# Patient Record
Sex: Female | Born: 1975
Health system: Southern US, Community
[De-identification: ages and names within clinical notes are randomized; demographics above are authoritative.]

## PROBLEM LIST (undated history)

## (undated) DIAGNOSIS — H6692 Otitis media, unspecified, left ear: Principal | ICD-10-CM

## (undated) DIAGNOSIS — F419 Anxiety disorder, unspecified: Secondary | ICD-10-CM

## (undated) DIAGNOSIS — D649 Anemia, unspecified: Secondary | ICD-10-CM

## (undated) DIAGNOSIS — Z0000 Encounter for general adult medical examination without abnormal findings: Secondary | ICD-10-CM

## (undated) DIAGNOSIS — R7989 Other specified abnormal findings of blood chemistry: Secondary | ICD-10-CM

## (undated) DIAGNOSIS — K219 Gastro-esophageal reflux disease without esophagitis: Secondary | ICD-10-CM

## (undated) DIAGNOSIS — Z87442 Personal history of urinary calculi: Secondary | ICD-10-CM

## (undated) DIAGNOSIS — C539 Malignant neoplasm of cervix uteri, unspecified: Secondary | ICD-10-CM

## (undated) DIAGNOSIS — Z124 Encounter for screening for malignant neoplasm of cervix: Secondary | ICD-10-CM

## (undated) DIAGNOSIS — K5900 Constipation, unspecified: Secondary | ICD-10-CM

## (undated) DIAGNOSIS — F329 Major depressive disorder, single episode, unspecified: Secondary | ICD-10-CM

## (undated) DIAGNOSIS — F32A Depression, unspecified: Secondary | ICD-10-CM

## (undated) DIAGNOSIS — E782 Mixed hyperlipidemia: Secondary | ICD-10-CM

## (undated) DIAGNOSIS — J329 Chronic sinusitis, unspecified: Secondary | ICD-10-CM

## (undated) DIAGNOSIS — T7840XA Allergy, unspecified, initial encounter: Secondary | ICD-10-CM

## (undated) DIAGNOSIS — Z87891 Personal history of nicotine dependence: Secondary | ICD-10-CM

## (undated) HISTORY — DX: Gastro-esophageal reflux disease without esophagitis: K21.9

## (undated) HISTORY — PX: LITHOTRIPSY: SUR834

## (undated) HISTORY — DX: Depression, unspecified: F32.A

## (undated) HISTORY — DX: Anxiety disorder, unspecified: F41.9

## (undated) HISTORY — DX: Personal history of urinary calculi: Z87.442

## (undated) HISTORY — DX: Otitis media, unspecified, left ear: H66.92

## (undated) HISTORY — DX: Major depressive disorder, single episode, unspecified: F32.9

## (undated) HISTORY — PX: CYSTOSCOPY W/ URETERAL STENT PLACEMENT: SHX1429

## (undated) HISTORY — DX: Mixed hyperlipidemia: E78.2

## (undated) HISTORY — DX: Anemia, unspecified: D64.9

## (undated) HISTORY — DX: Malignant neoplasm of cervix uteri, unspecified: C53.9

## (undated) HISTORY — DX: Encounter for screening for malignant neoplasm of cervix: Z12.4

## (undated) HISTORY — DX: Other specified abnormal findings of blood chemistry: R79.89

## (undated) HISTORY — DX: Encounter for general adult medical examination without abnormal findings: Z00.00

## (undated) HISTORY — DX: Allergy, unspecified, initial encounter: T78.40XA

## (undated) HISTORY — DX: Constipation, unspecified: K59.00

## (undated) HISTORY — DX: Personal history of nicotine dependence: Z87.891

## (undated) HISTORY — DX: Chronic sinusitis, unspecified: J32.9

---

## 1998-10-10 ENCOUNTER — Emergency Department (HOSPITAL_COMMUNITY): Admission: EM | Admit: 1998-10-10 | Discharge: 1998-10-10 | Payer: Self-pay | Admitting: Emergency Medicine

## 2003-03-05 ENCOUNTER — Other Ambulatory Visit: Admission: RE | Admit: 2003-03-05 | Discharge: 2003-03-05 | Payer: Self-pay | Admitting: Obstetrics and Gynecology

## 2003-03-11 ENCOUNTER — Ambulatory Visit (HOSPITAL_COMMUNITY): Admission: RE | Admit: 2003-03-11 | Discharge: 2003-03-11 | Payer: Self-pay | Admitting: Obstetrics and Gynecology

## 2003-03-11 ENCOUNTER — Encounter: Payer: Self-pay | Admitting: Obstetrics and Gynecology

## 2003-05-22 ENCOUNTER — Inpatient Hospital Stay (HOSPITAL_COMMUNITY): Admission: AD | Admit: 2003-05-22 | Discharge: 2003-05-26 | Payer: Self-pay | Admitting: Obstetrics and Gynecology

## 2003-07-28 ENCOUNTER — Inpatient Hospital Stay (HOSPITAL_COMMUNITY): Admission: AD | Admit: 2003-07-28 | Discharge: 2003-07-28 | Payer: Self-pay | Admitting: Obstetrics and Gynecology

## 2003-08-17 ENCOUNTER — Inpatient Hospital Stay (HOSPITAL_COMMUNITY): Admission: AD | Admit: 2003-08-17 | Discharge: 2003-08-17 | Payer: Self-pay | Admitting: Obstetrics and Gynecology

## 2003-09-14 ENCOUNTER — Inpatient Hospital Stay (HOSPITAL_COMMUNITY): Admission: AD | Admit: 2003-09-14 | Discharge: 2003-09-16 | Payer: Self-pay | Admitting: Obstetrics and Gynecology

## 2004-11-03 ENCOUNTER — Other Ambulatory Visit: Admission: RE | Admit: 2004-11-03 | Discharge: 2004-11-03 | Payer: Self-pay | Admitting: Obstetrics and Gynecology

## 2005-01-16 ENCOUNTER — Emergency Department (HOSPITAL_COMMUNITY): Admission: EM | Admit: 2005-01-16 | Discharge: 2005-01-16 | Payer: Self-pay | Admitting: Family Medicine

## 2005-06-17 ENCOUNTER — Emergency Department (HOSPITAL_COMMUNITY): Admission: EM | Admit: 2005-06-17 | Discharge: 2005-06-17 | Payer: Self-pay | Admitting: Family Medicine

## 2005-11-22 ENCOUNTER — Other Ambulatory Visit: Admission: RE | Admit: 2005-11-22 | Discharge: 2005-11-22 | Payer: Self-pay | Admitting: Obstetrics and Gynecology

## 2006-05-22 ENCOUNTER — Inpatient Hospital Stay (HOSPITAL_COMMUNITY): Admission: RE | Admit: 2006-05-22 | Discharge: 2006-05-24 | Payer: Self-pay | Admitting: Obstetrics and Gynecology

## 2006-06-02 ENCOUNTER — Inpatient Hospital Stay: Admission: AD | Admit: 2006-06-02 | Discharge: 2006-06-02 | Payer: Self-pay | Admitting: Obstetrics and Gynecology

## 2009-05-06 ENCOUNTER — Ambulatory Visit: Payer: Self-pay | Admitting: Family Medicine

## 2009-05-06 DIAGNOSIS — F329 Major depressive disorder, single episode, unspecified: Secondary | ICD-10-CM

## 2009-05-06 DIAGNOSIS — R1013 Epigastric pain: Secondary | ICD-10-CM

## 2009-05-06 DIAGNOSIS — K3189 Other diseases of stomach and duodenum: Secondary | ICD-10-CM

## 2009-06-09 ENCOUNTER — Ambulatory Visit: Payer: Self-pay | Admitting: Family Medicine

## 2009-06-30 ENCOUNTER — Telehealth: Payer: Self-pay | Admitting: Family Medicine

## 2009-08-02 ENCOUNTER — Telehealth: Payer: Self-pay | Admitting: Family Medicine

## 2009-08-03 ENCOUNTER — Telehealth: Payer: Self-pay | Admitting: Family Medicine

## 2009-08-05 ENCOUNTER — Ambulatory Visit: Payer: Self-pay | Admitting: Family Medicine

## 2009-08-06 ENCOUNTER — Encounter (INDEPENDENT_AMBULATORY_CARE_PROVIDER_SITE_OTHER): Payer: Self-pay | Admitting: *Deleted

## 2010-07-19 NOTE — Progress Notes (Signed)
Summary: Trouble sleeping  Phone Note Call from Patient   Caller: Patient Summary of Call: Pt LMOM stating you asked her to call if she was still having trouble sleeping and she is. Please advise.  Initial call taken by: Payton Spark CMA,  June 30, 2009 1:11 PM  Follow-up for Phone Call        Have her try taking the Alprazolam at bedtime for the next wk and let me know if this is helping. Follow-up by: Seymour Bars DO,  June 30, 2009 1:14 PM     Appended Document: Trouble sleeping Pt agreed to try and will CB next week.

## 2010-07-19 NOTE — Assessment & Plan Note (Signed)
Summary: depression   Vital Signs:  Patient profile:   35 year old female Menstrual status:  regular Height:      62 inches Weight:      104 pounds BMI:     19.09 O2 Sat:      99 % on Room air Temp:     99.2 degrees F oral Pulse rate:   60 / minute BP sitting:   103 / 61  (right arm) Cuff size:   regular  Vitals Entered By: Payton Spark CMA/April (August 05, 2009 4:02 PM)  O2 Flow:  Room air CC: f/u on meds    Primary Care Provider:  Seymour Bars DO  CC:  f/u on meds .  History of Present Illness: 35 yo WF presents for f/u depression.  She has been under a lot of stress with furniture market and missing work with her child being sick.  She talked to her husband and her boss about some of her stressors and that is making her feel better.  She stayed home and just slept.    She denies anxiety, panic attacks or suicidal thoughts.   She is not drinking ETOH.  She is yet to start exercising again.  Current Medications (verified): 1)  Nuvaring 0.12-0.015 Mg/24hr Ring (Etonogestrel-Ethinyl Estradiol) .... Use As Directed 2)  Zoloft 50 Mg Tabs (Sertraline Hcl) .Marland Kitchen.. 1 Tab By Mouth Daily 3)  Alprazolam 0.25 Mg Tabs (Alprazolam) .Marland Kitchen.. 1 Tab By Mouth Two Times A Day As Needed Anxiety 4)  Omeprazole 40 Mg Cpdr (Omeprazole) .Marland Kitchen.. 1 Tab By Mouth Daily; Take 30 Min Before Breakfast 5)  Tamiflu 75 Mg Caps (Oseltamivir Phosphate) .Marland Kitchen.. 1 Tab By Mouth Daily X 10 Days  Allergies (verified): No Known Drug Allergies  Past History:  Past Medical History: Reviewed history from 05/06/2009 and no changes required. none [id number]  Social History: Reviewed history from 05/06/2009 and no changes required. 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 Married to [place].  Has 2 daughters, 3 and 5. 2 cigs/ wk.  2-4 ETOH/ wk. No regular exercise. Fair diet.   Review of Systems Psych:  Complains of anxiety, depression, easily tearful, and irritability; denies panic attacks, sense of great danger, suicidal  thoughts/plans, thoughts of violence, unusual visions or sounds, and thoughts /plans of harming others.  Physical Exam  General:  alert, well-developed, well-nourished, and well-hydrated.   Skin:  color normal.   Psych:  good eye contact, not anxious appearing, and flat affect.     Impression & Recommendations:  Problem # 1:  DEPRESSION (ICD-311) Having more problems at work and home which have caused more symptoms of depression.  We discussed looking at some changes to her work schedule and having more time for herself.  Will try to go up to 100 mg of Zoloft once daily.  Will add counseling.  Call if any problems o/w f/u in 2-3 months.   Her updated medication list for this problem includes:    Zoloft 50 Mg Tabs (Sertraline hcl) .Marland Kitchen... 2 tabs by mouth once daily    Alprazolam 0.25 Mg Tabs (Alprazolam) .Marland Kitchen... 1 tab by mouth two times a day as needed anxiety  Orders: Psychology Referral (Psychology)  Complete Medication List: 1)  Nuvaring 0.12-0.015 Mg/24hr Ring (Etonogestrel-ethinyl estradiol) .... Use as directed 2)  Zoloft 50 Mg Tabs (Sertraline hcl) .... 2 tabs by mouth once daily 3)  Alprazolam 0.25 Mg Tabs (Alprazolam) .Marland Kitchen.. 1 tab by mouth two times a day as needed anxiety 4)  Omeprazole  40 Mg Cpdr (Omeprazole) .Marland Kitchen.. 1 tab by mouth daily; take 30 min before breakfast 5)  Tamiflu 75 Mg Caps (Oseltamivir phosphate) .Marland Kitchen.. 1 tab by mouth daily x 10 days  Patient Instructions: 1)  Try to go up on Zoloft to 100 mg once a day. 2)  Call and let me know next wk how this is working for you. 3)  Counseling referral made. 4)  Return for f/u in 2-3 mos. Prescriptions: ZOLOFT 50 MG TABS (SERTRALINE HCL) 2 tabs by mouth once daily  #60 x 1   Entered and Authorized by:   Seymour Bars DO   Signed by:   Seymour Bars DO on 08/05/2009   Method used:   Electronically to        [facility]* (retail)       [address] [place], [place]  [address]       Ph: [phone]       Fax:  [phone]   RxID:   [id number]

## 2010-07-19 NOTE — Progress Notes (Signed)
Summary: Depressed  Phone Note Call from Patient   Caller: Patient Summary of Call: Pt wanted you to know that she has been under more stress at work and husband has been out of town for over a week. She feels herself being more depressed again and crying alot. Pt is taking zoloft as directed. Please advise.  Initial call taken by: Payton Spark CMA,  August 02, 2009 1:16 PM  Follow-up for Phone Call        set up visit with me this wk. Follow-up by: Seymour Bars DO,  August 02, 2009 1:40 PM     Appended Document: Depressed Scheduled for Thurs

## 2010-07-19 NOTE — Progress Notes (Signed)
Summary: Requests Tamiflu   Phone Note Call from Patient   Caller: Patient Summary of Call: Pt son was Dx w/ Flu. Peds recommended Pt to start Tamiflu for prevention. Pt has apt w/ you on Thurs for f/u depression but thought she may need to start Tamiflu before Thurs. Please advise.  Initial call taken by: Payton Spark CMA,  August 03, 2009 2:31 PM    New/Updated Medications: TAMIFLU 75 MG CAPS (OSELTAMIVIR PHOSPHATE) 1 tab by mouth daily x 10 days Prescriptions: TAMIFLU 75 MG CAPS (OSELTAMIVIR PHOSPHATE) 1 tab by mouth daily x 10 days  #10 x 0   Entered and Authorized by:   Seymour Bars DO   Signed by:   Seymour Bars DO on 08/03/2009   Method used:   Electronically to        [facility]* (retail)       [address] [place], [place]  [address]       Ph: [phone]       Fax: [phone]   RxID:   [id number]   Appended Document: Requests Tamiflu  LMOM informing Pt of the above

## 2010-07-19 NOTE — Letter (Signed)
Summary: Primary Care Consult Scheduled Letter  [place] [facility]  [address]   [place], [place] [address]   Phone: [phone]  Fax: [phone]      08/06/2009 MRN: [id number]  Nancy Higgins [address] [place], [place]  [address]    Dear Ms. Caso,      We have scheduled an appointment for you.  At the recommendation of Dr.BOWEN , we have scheduled you a consult with Elray Buba, [facility] [place] [facility] on MARCH 10,2011  at 12:30PM .  Their address [phone]  , [place] . The office phone number is [phone].  If this appointment day and time is not convenient for you, please feel free to call the office of the doctor you are being referred to at the number listed above and reschedule the appointment.     It is important for you to keep your scheduled appointments. We are here to make sure you are given good patient care. If you have questions or you have made changes to your appointment, please notify us at  [phone], ask for HELEN.    Thank you,  Patient Care Coordinator [place] [facility]

## 2010-10-04 ENCOUNTER — Other Ambulatory Visit: Payer: Self-pay | Admitting: Family Medicine

## 2010-11-04 NOTE — Consult Note (Signed)
[name], Nancy Higgins                        ACCOUNT NO.:  [id number]   MEDICAL RECORD NO.:  [id number]                   PATIENT TYPE:  OBV   LOCATION:  9324                                 [facility]   PHYSICIAN:  Maretta Bees. Vonita Moss, M.D.             DATE OF BIRTH:  12/08/1975   DATE OF CONSULTATION:  05/22/2003  DATE OF DISCHARGE:                                   CONSULTATION   ADDENDUM:  I reviewed her KUB on the way out of the hospital and there certainly is  what appears to be a phlebolith in the left bony pelvis but there are two  small 1-mm opacities in the right bony pelvis that could be in the vicinity  of the very distal right ureter.  I informed the patient about this finding.                                               Maretta Bees. Vonita Moss, M.D.    LJP/MEDQ  D:  05/22/2003  T:  05/22/2003  Job:  [id number]   cc:   Huel Cote, M.D.  [address] [place], [address]  [place], [place] [address]  Fax: [phone]

## 2010-11-04 NOTE — Discharge Summary (Signed)
NAMEBELKY, Nancy Higgins                        ACCOUNT NO.:  [id number]   MEDICAL RECORD NO.:  [id number]                   PATIENT TYPE:  INP   LOCATION:  9102                                 [facility]   PHYSICIAN:  Huel Cote, M.D.              DATE OF BIRTH:  01-31-1976   DATE OF ADMISSION:  09/14/2003  DATE OF DISCHARGE:  09/16/2003                                 DISCHARGE SUMMARY   DISCHARGE DIAGNOSES:  1. Term pregnancy at 54 and five-sevenths weeks, delivered.  2. Status post normal spontaneous vaginal delivery.   DISCHARGE MEDICATIONS:  1. Motrin 600 mg p.o. q.6h. p.r.n.  2. Percocet one to two tablets p.o. q.4h. p.r.n.   HOSPITAL COURSE:  The patient is a 35 year old G1 P0 who is admitted at 44  and five-sevenths weeks to labor and delivery for induction of labor given a  favorable cervix.  Prenatal care had been complicated by a possible small-  for-gestational-age infant by fundal height; however, had normal growth on  ultrasound, and also had nephrolithiasis at [redacted] weeks gestation which  required a temporary ureteral stent which was removed in December 2004.  Prenatal labs are as follows:  O positive, antibody negative, RPR  nonreactive, rubella immune, hepatitis B surface antigen negative, HIV  negative, GC negative, chlamydia negative, group B strep negative, triple  screen normal, one-hour Glucola normal.  Past obstetrical history:  None.  Past GYN history:  No abnormal Pap smears.  Past surgical history:  She had  the ureteral stent placed in December 2004.  Past medical history:  The  nephrolithiasis.  She was afebrile on admission with stable vital signs,  fetal heart rate was reactive.  Cervix was 50 and 3 and -2 station.  She had  rupture of membranes performed with clear fluid obtained and was placed on  IV Pitocin.  She progressed well throughout the day, reached complete  dilation, and pushed for about 1 hour with a normal spontaneous vaginal  delivery of a vigorous female infant over a second degree perineal  laceration.  Apgars were 8 and 9, weight was 7 pounds 13 ounces, placenta  delivered spontaneously. There was a loose nuchal cord x1 on the baby which  was reduced over the head.  Second degree laceration was repaired with 2-0  Vicryl.  A small right periurethral laceration was repaired with 3-0 Vicryl  with interrupted sutures for hemostasis.  Cervix and rectum were intact.  She was then admitted for routine postpartum care and did very well.  On  postpartum day #2 was tolerating her pain well and working on breastfeeding.  She was afebrile with stable vital signs, discharge hemoglobin was 9.9, and  she was felt stable for discharge home.  Huel Cote, M.D.    KR/MEDQ  D:  09/16/2003  T:  09/16/2003  Job:  [id number]

## 2010-11-04 NOTE — Discharge Summary (Signed)
NAMEKATIYA, FIKE              ACCOUNT NO.:  [id number]   MEDICAL RECORD NO.:  [id number]          PATIENT TYPE:  INP   LOCATION:  9121                          [facility]   PHYSICIAN:  Huel Cote, M.D. DATE OF BIRTH:  02-May-1976   DATE OF ADMISSION:  05/22/2006  DATE OF DISCHARGE:  05/24/2006                               DISCHARGE SUMMARY   DISCHARGE DIAGNOSES:  1. Term pregnancy at 39-5/7 weeks, delivered.  2. Status post normal spontaneous vaginal delivery.   DISCHARGE MEDICATIONS:  1. Motrin 600 mg p.o. every 6 hours.  2. Percocet 1 to 2 tablets p.o. every 4 hours p.r.n.   HOSPITAL COURSE:  The patient is a 35 year old G2, P1-0-0-1 who was  admitted at 39-5/[redacted] weeks gestation for induction of labor given term  status and favorable cervix, prenatal care being complicated only by  family history of a neural tube defect for which she was instructed to  take 4 mg of folic acid as a preventative therapy.  She was also group B  strep positive.   PRENATAL LABS:  O positive, antibody negative, RPR nonreactive, rubella  immune, hepatitis B surface antigen negative, HIV declined, GC negative,  chlamydia negative, group B strep positive, 1-hour Glucola 100.   PAST OBSTETRICAL HISTORY:  In March of 2005, she had a vaginal delivery  of a 7 pound 13 ounce female infant.   PAST GYNECOLOGICAL HISTORY:  None.   PAST MEDICAL HISTORY:  None.   PAST SURGICAL HISTORY:  None.   ALLERGIES:  SHE IS NOT ALLERGIC TO ANY MEDICATIONS.   PHYSICAL EXAMINATION:  VITAL SIGNS:  On admission, she was afebrile with  stable vital signs.  PELVIC:  Fetal heart rate was reactive.  The cervix was 70, 2, and a -2  station.   HOSPITAL COURSE:  She was placed on penicillin for her group B strep  prophylaxis and had rupture of membranes performed.  On Pitocin, the  patient progressed well and reached complete dilation and pushed for  approximately 30 minutes, with the vaginal delivery of a  female infant  over a second-degree laceration.  Apgars were 8 and 9.  Weight was 8  pounds 9 ounces.  The placenta delivered spontaneously.  There was a  nuchal cord x1 reduced over the head.  She had a second-degree perineal  laceration as well as a periurethral laceration which required repair  for hemostasis.  These were repaired with 3-0 Vicryl and 2-0 Vicryl.   On postpartum day #1, she was doing well and voiding without difficulty.  On postpartum day #2, she remained afebrile and had normal blood  pressure.  Her fundus was firm and lochia normal.  Her discharge  hemoglobin was 9.4, and she was felt stable for discharge home.      Huel Cote, M.D.  Electronically Signed     KR/MEDQ  D:  05/24/2006  T:  05/24/2006  Job:  [id number]

## 2010-11-04 NOTE — Consult Note (Signed)
NAMEPAYDEN, Nancy Higgins                        ACCOUNT NO.:  [id number]   MEDICAL RECORD NO.:  [id number]                   PATIENT TYPE:  OBV   LOCATION:  9324                                 [facility]   PHYSICIAN:  Maretta Bees. Vonita Moss, M.D.             DATE OF BIRTH:  January 09, 1976   DATE OF CONSULTATION:  05/22/2003  DATE OF DISCHARGE:                                   CONSULTATION   I was asked to see this 35 year old white female for consultation since she  was admitted last night for right flank pain.  I actually saw her in the  office yesterday for right flank pain and an ultrasound showed right  hydronephrosis that could either be physiologic or pathologic.  Urine  culture subsequently was negative.  She was sent home on pain medications  but the pain became severe enough to get admitted.   She is feeling better this afternoon with lesser degrees of pain.  She has  no undue urinary frequency.  She has not seen the stone pass.  She has had  no hematuria.   A KUB today showed no definite right ureteral calculus but looked like there  probably was a 3-mm left pelvic phlebolith.   I talked to the patient and her parents.  I discussed the situation and the  fact that I still think that likely she could have a small right ureteral  stone that is just not visualized.  Hopefully if she has one, she will pass  it in the near future or at least have a lesser degree of pain and  symptomatology.  I told her if she could not get out of the hospital this  weekend because of the pain, she needed to seriously consider right double-J  catheter placement that would be left in for the duration of her pregnancy.  If she improves or passes the stone, I will see her in followup in the  office in the next week or two.                                               Maretta Bees. Vonita Moss, M.D.    LJP/MEDQ  D:  05/22/2003  T:  05/22/2003  Job:  [id number]   cc:   Huel Cote, M.D.  [address]  [place], [address]  [place], [place] [address]  Fax: [phone]

## 2010-11-04 NOTE — Op Note (Signed)
Nancy Higgins, Nancy Higgins                        ACCOUNT NO.:  [id number]   MEDICAL RECORD NO.:  [id number]                   PATIENT TYPE:  INP   LOCATION:  9324                                 [facility]   PHYSICIAN:  Maretta Bees. Vonita Moss, M.D.             DATE OF BIRTH:  July 07, 1975   DATE OF PROCEDURE:  05/25/2003  DATE OF DISCHARGE:                                 OPERATIVE REPORT   PREOPERATIVE DIAGNOSES:  1. Right hydronephrosis.  2. Suspected right ureteral calculus but rule out pyelonephritis.   POSTOPERATIVE DIAGNOSES:  1. Right hydronephrosis.  2. Suspected right ureteral calculus but rule out pyelonephritis.   PROCEDURE:  Cystoscopy and insertion of right double J catheter.   SURGEON:  Maretta Bees. Vonita Moss, M.D.   ANESTHESIA:  Spinal.   INDICATIONS FOR PROCEDURE:  This is a 35 year old white female who is [redacted]  weeks pregnant, has had intermittent and often times severe right flank  pain.  I saw her last week.  She had a hydronephrosis on ultrasound.  Urine  culture was negative.  She was hospitalized for pain with fever, is now on  Ancef.  Repeat ultrasound was suspicious for some pyelonephritis and  persistent hydronephrosis.  KUB the other day showed left pelvic phleboliths  but two tiny calcifications could be in the distal right ureter.  Because of  the persistent symptomatology and hospitalizations, she is brought to the OR  today for double J catheter placement.   DESCRIPTION OF PROCEDURE:  The patient is brought to the operating room,  placed in the lithotomy position. After the induction of satisfactory spinal  anesthesia, she was cystoscoped and bladder was unremarkable.  I placed a 6  French 26 cm double J catheter over a guidewire and inserted the guidewire  up to the right ureter without any difficulty.  The guidewire and the stent  went up quite easily and pulled out the guidewire and left a full coil in  the bladder.  Bladder was emptied.  Cystoscope  removed.  The patient was  sent to the recovery room in good condition, having tolerated the procedure  well.                                               Maretta Bees. Vonita Moss, M.D.    LJP/MEDQ  D:  05/25/2003  T:  05/26/2003  Job:  [id number]

## 2010-11-04 NOTE — Discharge Summary (Signed)
Nancy Higgins, Nancy Higgins                        ACCOUNT NO.:  [id number]   MEDICAL RECORD NO.:  [id number]                   PATIENT TYPE:  INP   LOCATION:  9324                                 [facility]   PHYSICIAN:  Malachi Pro. Ambrose Mantle, M.D.              DATE OF BIRTH:  1975/07/24   DATE OF ADMISSION:  05/21/2003  DATE OF DISCHARGE:  05/26/2003                                 DISCHARGE SUMMARY   HISTORY:  A 35 year old white female para 0, gravida 1 at 23+ weeks  gestation admitted for pain control with presumed diagnosis of kidney stone.  The patient had onset of severe flank pain on the morning of the admission.  Examination was consistent with kidney stone but there was no blood in the  urine.  Malachi Pro. Ambrose Mantle, M.D. saw the patient, sent her to see Maretta Bees.  Vonita Moss, M.D. and a renal ultrasound was performed.  There was no clear  stone seen but clinically the history and findings were compatible with a  stone.  She was treated with pain medications at home but she began to have  nausea and vomiting, no ability to tolerate medications, so was admitted for  IV hydration and medications.   PAST MEDICAL HISTORY:  Negative.   PAST SURGICAL HISTORY:  Negative.   PAST OBSTETRICAL HISTORY:  Negative.   PAST GYNECOLOGIC HISTORY:  Negative.   ALLERGIES:  No known drug allergies.   PHYSICAL EXAMINATION:  VITAL SIGNS:  Temperature 99.8, blood pressure 96/55,  pulse 85.  HEART:  Normal size and sounds.  No murmurs.  LUNGS:  Clear to P&A.  ABDOMEN:  Soft and gravid.  Positive pain in the right flank.   ADMITTING IMPRESSIONS:  1. Intrauterine pregnancy at 23 weeks.  2. Presumed nephrolithiasis with inability to tolerate oral pain     medications.   HOSPITAL COURSE:  She is admitted for hydration and pain control.  On the  first day after admission she was still in pain.  She was seen on May 22, 2003 by Maretta Bees. Vonita Moss, M.D.  Ultrasound showed right hydronephrosis,  physiologic versus pathologic.  Urine culture was negative.  KUB of the  abdomen showed no definite stone.  He still suspected stone.  Considered a  right double J stent if she could not tolerate the pain at home.  Maretta Bees.  Vonita Moss, M.D. reviewed the x-ray and found two opacities about 1 mm in size  in the vicinity of the distal right ureter.  On May 23, 2003 the patient  was feeling better, but had more nausea and epigastric pain.  On the fourth  hospital day the patient's pain had worsened and she had spiked a  temperature to 101.1.  She was begun on Ancef.  Zenaida Niece, M.D.  ordered ultrasound of the gallbladder.  On May 25, 2003 the patient  still had right flank pain and CVA tenderness.  Maretta Bees. Vonita Moss, M.D. saw  the patient and advised cystoscopy with double J stent.  Maretta Bees. Vonita Moss,  M.D. performed the cystoscopy, inserted the right double J catheter.  No x-  ray was done.  Postoperatively the patient's pain was much improved.  Culture was negative.  Maretta Bees. Vonita Moss, M.D. felt she should be discharged  on Keflex for two or three days.  The patient's initial hemoglobin was 9.8,  hematocrit 28.3, white count 14,000, platelet count 209,000, 86 neutrophils,  5 lymphocytes, 7 monocytes, 2 eosinophils.  Follow-up hemoglobin December 7  white count 11,200.  A BMET showed a sodium of 134, potassium of 3.2,  glucose of 135.  Urinalysis showed no red cells.  Culture was negative.  Abdomen x-ray done on May 22, 2003 showed limited visualization of the  kidneys due to overlying colonic stool.  No radioopaque calculi were  identified along the course of the right ureter.  There was a 3 mm  calcification in the inferior left pelvis likely representing a phlebolith,  although a tiny calculus in the bladder, a left UPJ could not be definitely  excluded.  Ultrasound of the abdomen on May 24, 2003 showed no evidence  of cholelithiasis, although the gallbladder was not  completely distended.  There was no evidence of a gallbladder wall thickening or biliary  dilatation.  There was right pleural fluid with right perinephric fluid and  edema.  Given the associated mild right hydronephrosis, right pyelonephritis  would be a consideration.  There was also evidence for ascites of the  inferior tip of the liver and in the left lower quadrant.   FINAL DIAGNOSES:  Intrauterine pregnancy 23-24 weeks with presumed right  ureteral stone and severe pain in the right flank.   OPERATION:  Cystoscopy and insertion of right double J stent.   FINAL CONDITION:  Improved.   DISCHARGE INSTRUCTIONS:  Regular diet.  No vaginal entrance.  Report any  temperature greater than 100 degrees.  Report any unusual problems.  Return  to see Maretta Bees. Vonita Moss, M.D. and to our office in one week.                                               Malachi Pro. Ambrose Mantle, M.D.    TFH/MEDQ  D:  05/26/2003  T:  05/26/2003  Job:  [id number]   cc:   Maretta Bees. Vonita Moss, M.D.  [address]  [place]  [place] [address]  Fax: [phone]

## 2011-09-05 ENCOUNTER — Encounter: Payer: Self-pay | Admitting: Family Medicine

## 2011-09-05 ENCOUNTER — Ambulatory Visit (INDEPENDENT_AMBULATORY_CARE_PROVIDER_SITE_OTHER): Payer: BC Managed Care – PPO | Admitting: Family Medicine

## 2011-09-05 ENCOUNTER — Other Ambulatory Visit: Payer: Self-pay | Admitting: Family Medicine

## 2011-09-05 VITALS — BP 98/66 | HR 67 | Temp 98.3°F | Ht 64.0 in | Wt 106.0 lb

## 2011-09-05 DIAGNOSIS — T7840XA Allergy, unspecified, initial encounter: Secondary | ICD-10-CM

## 2011-09-05 DIAGNOSIS — Z87442 Personal history of urinary calculi: Secondary | ICD-10-CM

## 2011-09-05 DIAGNOSIS — R002 Palpitations: Secondary | ICD-10-CM

## 2011-09-05 DIAGNOSIS — K3189 Other diseases of stomach and duodenum: Secondary | ICD-10-CM

## 2011-09-05 DIAGNOSIS — R1013 Epigastric pain: Secondary | ICD-10-CM

## 2011-09-05 DIAGNOSIS — R1012 Left upper quadrant pain: Secondary | ICD-10-CM

## 2011-09-05 DIAGNOSIS — F419 Anxiety disorder, unspecified: Secondary | ICD-10-CM

## 2011-09-05 DIAGNOSIS — F341 Dysthymic disorder: Secondary | ICD-10-CM

## 2011-09-05 DIAGNOSIS — F329 Major depressive disorder, single episode, unspecified: Secondary | ICD-10-CM

## 2011-09-05 DIAGNOSIS — K5900 Constipation, unspecified: Secondary | ICD-10-CM

## 2011-09-05 DIAGNOSIS — R634 Abnormal weight loss: Secondary | ICD-10-CM

## 2011-09-05 DIAGNOSIS — Z0000 Encounter for general adult medical examination without abnormal findings: Secondary | ICD-10-CM

## 2011-09-05 DIAGNOSIS — K219 Gastro-esophageal reflux disease without esophagitis: Secondary | ICD-10-CM | POA: Insufficient documentation

## 2011-09-05 MED ORDER — ALPRAZOLAM 0.25 MG PO TABS: ORAL_TABLET | ORAL | Status: DC

## 2011-09-05 MED ORDER — RANITIDINE HCL 300 MG PO TABS
300.0000 mg | ORAL_TABLET | Freq: Every day | ORAL | Status: DC
Start: 2011-09-05 — End: 2011-12-29

## 2011-09-05 NOTE — Assessment & Plan Note (Signed)
Worsening dyspepsia, epigastric and luq pain, check labs including a H Pylori and start Ranitidine 300mg  and Mylanta prn, if H Pylori is negative severity of symptoms warrants a referral to GI

## 2011-09-05 NOTE — Assessment & Plan Note (Signed)
One episode during pregnancy requiring lithotripsy, stenting. Encouraged ongoing increased hydration

## 2011-09-05 NOTE — Assessment & Plan Note (Signed)
Improved with addition of Colace now moving her bowels several times a week, historically only moved her bowels once a week. Encouraged her to start a probiotic such as Librarian, academic daily

## 2011-09-05 NOTE — Progress Notes (Signed)
Patient ID: Nancy Higgins, female   DOB: 06-Oct-1975, 36 y.o.   MRN: [id number] BRYANT LIPPS [id number] 1975-10-16 09/05/2011      Progress Note New Patient  Subjective  Chief Complaint  Chief Complaint  Patient presents with  . new to establish    increased burping, heartburn, abdominal cramping    HPI  This is a 36 year old Caucasian female who is in today for urgent new patient appointment. Severe long history of reflux and constipation dating back to early adulthood. Historically she moved her bowels roughly once a week but now with Colace she is moving them a couple times a week. She denies any bloody or tarry stool. She describes dyspepsia and substernal burning sour taste in the throat and burning in the throat in the morning. She describes epigastric pain and cramping left upper quadrant pain cramping. She notes a raw vegetables and acidic foods make her symptoms worse. She is also complaining of a long history of intermittent depression and anxiety. Has previously been on Zoloft for postpartum depression but would prefer not to go back on daily medication. Her gynecologist whom she sees in [facility] recommended she go on Prozac recently for premenstrual dysphoric disease and thus far she has not. She is hesitant to restart a daily medication although she did not have any active problems with Zoloft. She is describing episodes of panic attacks which she also says she's had for years. The symptoms occur with the chest pain and other times occur separately. She describes herself as someone who gets anxious easily gets palpitations diaphoresis her diphtheria and nausea with those episodes. She has recently quit smoking. Did smoke a quarter pack per day until about 2 months ago and now occasionally does smoke a cigarette daily still when she drinks alcohol. Due to her dyspepsia she has recently stopped caffeine and alcohol was drinking roughly 2 glasses of wine nightly. She denies  any flare in allergies although she often has trouble in the spring. She denies any obvious recent illness. No fevers, chills, congestion or urinary complaints are noted.  Past Medical History  Diagnosis Date  . GERD (gastroesophageal reflux disease)   . Anxiety   . Allergy   . Constipation   . Anemia     with pregnancy  . History of renal stone   . Anxiety and depression     Past Surgical History  Procedure Date  . Lithotripsy     once during pregnancy  . Cystoscopy w/ ureteral stent placement     and removal    Family History  Problem Relation Age of Onset  . Thyroid disease Mother   . Other Mother     uterine prolapse  . Scoliosis Mother   . Thyroid disease Father   . Thyroid disease Brother   . COPD Maternal Grandmother   . Cancer Maternal Grandfather     colon cancer  . Scoliosis Sister   . Scoliosis Sister     History   Social History  . Marital Status: Married    Spouse Name: N/A    Number of Children: N/A  . Years of Education: N/A   Occupational History  . Not on file.   Social History Main Topics  . Smoking status: Former Games developer  . Smokeless tobacco: Not on file  . Alcohol Use: Yes  . Drug Use: No  . Sexually Active: Not on file   Other Topics Concern  . Not on file   Social History Narrative  .  No narrative on file    No current outpatient prescriptions on file prior to visit.    No Known Allergies  Review of Systems  Review of Systems  Constitutional: Negative for fever, chills and malaise/fatigue.  HENT: Negative for hearing loss, nosebleeds and congestion.   Eyes: Negative for discharge.  Respiratory: Negative for cough, sputum production, shortness of breath and wheezing.   Cardiovascular: Positive for palpitations. Negative for chest pain and leg swelling.  Gastrointestinal: Positive for heartburn, nausea, abdominal pain and constipation. Negative for vomiting, diarrhea, blood in stool and melena.  Genitourinary: Negative for  dysuria, urgency, frequency and hematuria.  Musculoskeletal: Negative for myalgias, back pain and falls.  Skin: Negative for rash.  Neurological: Negative for dizziness, tremors, sensory change, focal weakness, loss of consciousness, weakness and headaches.  Endo/Heme/Allergies: Negative for polydipsia. Does not bruise/bleed easily.  Psychiatric/Behavioral: Positive for depression. Negative for suicidal ideas. The patient is nervous/anxious. The patient does not have insomnia.     Objective  BP 98/66  Pulse 67  Temp(Src) 98.3 F (36.8 C) (Oral)  Ht 5\' 4"  (1.626 m)  Wt 106 lb (48.081 kg)  BMI 18.19 kg/m2  SpO2 96%  Physical Exam  Physical Exam  Constitutional: She is oriented to person, place, and time and well-developed, well-nourished, and in no distress. No distress.  HENT:  Head: Normocephalic and atraumatic.  Right Ear: External ear normal.  Left Ear: External ear normal.  Nose: Nose normal.  Mouth/Throat: Oropharynx is clear and moist. No oropharyngeal exudate.  Eyes: Conjunctivae are normal. Pupils are equal, round, and reactive to light. Right eye exhibits no discharge. Left eye exhibits no discharge. No scleral icterus.  Neck: Normal range of motion. Neck supple. No thyromegaly present.  Cardiovascular: Normal rate, regular rhythm, normal heart sounds and intact distal pulses.   No murmur heard. Pulmonary/Chest: Effort normal and breath sounds normal. No respiratory distress. She has no wheezes. She has no rales.  Abdominal: Soft. Bowel sounds are normal. She exhibits no distension and no mass. There is tenderness. There is no rebound and no guarding.       luq and epigastric pain with palpation  Musculoskeletal: Normal range of motion. She exhibits no edema and no tenderness.  Lymphadenopathy:    She has no cervical adenopathy.  Neurological: She is alert and oriented to person, place, and time. She has normal reflexes. No cranial nerve deficit. Coordination normal.    Skin: Skin is warm and dry. No rash noted. She is not diaphoretic.  Psychiatric: Mood, memory and affect normal.       Assessment & Plan  Allergic state No worries yet but does have to use Fexofenadine prn, may continue the same  Anxiety and depression Has taken Zoloft in the past for PPD and her gynecologist has recently recommended her she try Prozac for PMDD. She is hesitant to start a daily medication at this time but is willing to try a small amount of Alprazolam to use prn to see if it helps. Reassess at next months's appt  GERD (gastroesophageal reflux disease) Worsening dyspepsia, epigastric and luq pain, check labs including a H Pylori and start Ranitidine 300mg  and Mylanta prn, if H Pylori is negative severity of symptoms warrants a referral to GI  Constipation Improved with addition of Colace now moving her bowels several times a week, historically only moved her bowels once a week. Encouraged her to start a probiotic such as Align daily  History of renal stone One episode during pregnancy  requiring lithotripsy, stenting. Encouraged ongoing increased hydration

## 2011-09-05 NOTE — Assessment & Plan Note (Signed)
Has taken Zoloft in the past for PPD and her gynecologist has recently recommended her she try Prozac for PMDD. She is hesitant to start a daily medication at this time but is willing to try a small amount of Alprazolam to use prn to see if it helps. Reassess at next months's appt

## 2011-09-05 NOTE — Patient Instructions (Signed)
Helicobacter Pylori and Ulcer Disease An ulcer may be in your stomach (gastric ulcer) or in the first part of your small bowel, which is called the duodenum (duodenal ulcer). An ulcer is a break in the stomach or duodenum lining. The break wears down into the deeper tissue. Helicobacter pylori (H. pylori) is a type of germ (bacteria) that may cause the majority of gastric or duodenal ulcers. CAUSES   A germ (bacterium). H. pylori can weaken the protective mucous coating of the stomach and duodenum. This allows acid to get through to the sensitive lining of the stomach or duodenum and an ulcer can then form.   Certain medications.   Using substances that can bother the lining of the stomach (alcohol, tobacco or medications such as Advil or Motrin) in the presence of H.pylori infection. This can increase the chances of getting an ulcer.   Cancer (rarely).  Most people infected with H. pylori do not get ulcers. It is not known how people catch H. pylori. It may be through food or water. H. pylori has been found in the saliva of some infected people. Therefore, the bacteria may also spread through mouth-to-mouth contact such as kissing. SYMPTOMS  The problems (symptoms) of ulcer disease are usually:  A burning or gnawing of the mid-upper belly (abdomen). This is often worse on an empty stomach. It may get better with food. This may be associated with feeling sick to your stomach (nausea), bloating and vomiting.   If the ulcer results in bleeding, it can cause:   Black, tarry stools.   Throwing up bright red blood.   Throwing up coffee ground looking materials.  With severe bleeding, there may be loss of consciousness and shock. Besides ulcer disease, H. pylori can also cause chronic gastritis (irritation of the lining of the stomach without ulcer) or stomach acid-type discomfort. You may not have symptoms even though you have an H. pylori infection. Although this is an infection, you may not  have usual infection symptoms (such as fever). DIAGNOSIS  Ulcer disease can be diagnosed in many different ways. If you have an ulcer, it is important to know whether or not it is caused by H. Pylori. Treatment for an ulcer caused by H. pylori is different from that for an ulcer with other causes. The best way to detect H. pylori is taking tissue directly from the ulcer during an endoscopy test.   An endoscopy is an exam that uses an endoscope. This is a thin, lighted tube with a small camera on the end. It is like a flexible telescope. The patient is given a drug to make them calm (sedative). The caregiver eases the endoscope into the mouth and down the throat to the stomach and duodenum. This allows the doctor to see the lining of the esophagus, stomach and duodenum.   If an endoscopy is not needed, then H. pylori can be detected with tests of the blood, stool or even breath.  TREATMENT   H. pylori peptic ulcer treatment usually involves a combination of:   Medicines that kill germs (antibiotics).   Acid suppressors.   Stomach protectors.   The use of only one medication to treat H. pylori is not recommended. The most proven treatment is a 2 week course of treatment called triple therapy. It involves taking two antibiotics to kill the bacteria and either an acid suppressor or stomach-lining shield. Two-week triple therapy reduces ulcer symptoms, kills the bacteria, and prevents ulcers from coming back in many   patients.   Unfortunately, patients may find triple therapy hard to do. This is because it involves taking as many as 20 pills a day. Also, the antibiotics used in triple therapy may cause mild side effects. These include nausea, vomiting, diarrhea, dark stools, a metallic taste in the mouth, dizziness, headache and yeast infections in women. Talk to your caregiver if you have any of these side effects.  HOME CARE INSTRUCTIONS   Take your medications as directed and for as long as  prescribed. Contact your caregiver if you have problems or side effects from your medications.   Continue regular work and usual activities unless told otherwise by your caregiver.   Avoid tobacco, alcohol and caffeine. Tobacco use will decrease and slow healing.   Avoid medications that are harmful. This includes aspirin and NSAIDS such as ibuprofen and naproxen.   Avoid foods that seem to aggravate or cause discomfort.   There are many over-the-counter products available to control stomach acid and other symptoms. Discuss these with your caregiver before using them. Do not  stop taking prescription medications for over-the-counter medications without talking with your caregiver.   Special diets are not usually needed.   Keep any follow-up appointments and blood tests as directed.  SEEK MEDICAL CARE IF:   Your pain or other ulcer symptoms do not improve within a few days of starting treatment.   You develop diarrhea. This can be a problem related to certain treatments.   You have ongoing indigestion or heartburn even if your main ulcer symptoms are improved.   You think you have any side effects from your medications or if you do not understand how to use your medications right.  SEEK IMMEDIATE MEDICAL CARE IF:  Any of the following happen:  You develop bright red, rectal bleeding.   You develop dark black, tarry stools.   You throw up (vomit) blood.   You become light-headed, weak, have fainting episodes, or become sweaty, cold and clammy.   You have severe abdominal pain not controlled by medications. Do not take pain medications unless ordered by your caregiver.  MAKE SURE YOU:   Understand these instructions.   Will watch your condition.   Will get help right away if you are not doing well or get worse.  Document Released: 08/26/2003 Document Revised: 05/25/2011 Document Reviewed: 01/23/2008 Exit[employer] Patient Information 2012 [employer]. 

## 2011-09-05 NOTE — Progress Notes (Signed)
Addended by: Baldemar Lenis R on: 09/05/2011 05:06 PM   Modules accepted: Orders

## 2011-09-05 NOTE — Assessment & Plan Note (Signed)
No worries yet but does have to use Fexofenadine prn, may continue the same

## 2011-09-06 LAB — HEPATIC FUNCTION PANEL
ALT: 13 U/L (ref 0–35)
AST: 19 U/L (ref 0–37)
Albumin: 3.9 g/dL (ref 3.5–5.2)
Alkaline Phosphatase: 46 U/L (ref 39–117)
Bilirubin, Direct: 0.1 mg/dL (ref 0.0–0.3)
Indirect Bilirubin: 0.1 mg/dL (ref 0.0–0.9)
Total Bilirubin: 0.2 mg/dL — ABNORMAL LOW (ref 0.3–1.2)
Total Protein: 6 g/dL (ref 6.0–8.3)

## 2011-09-06 LAB — T4, FREE: Free T4: 0.8 ng/dL (ref 0.80–1.80)

## 2011-09-06 LAB — BASIC METABOLIC PANEL
BUN: 10 mg/dL (ref 6–23)
CO2: 23 mEq/L (ref 19–32)
Calcium: 8.6 mg/dL (ref 8.4–10.5)
Chloride: 106 mEq/L (ref 96–112)
Creat: 0.58 mg/dL (ref 0.50–1.10)
Glucose, Bld: 82 mg/dL (ref 70–99)
Potassium: 3.8 mEq/L (ref 3.5–5.3)
Sodium: 138 mEq/L (ref 135–145)

## 2011-09-06 LAB — TSH: TSH: 4.796 u[IU]/mL — ABNORMAL HIGH (ref 0.350–4.500)

## 2011-09-06 LAB — PHOSPHORUS: Phosphorus: 2.9 mg/dL (ref 2.3–4.6)

## 2011-09-06 LAB — CBC
HCT: 36.3 % (ref 36.0–46.0)
Hemoglobin: 11.4 g/dL — ABNORMAL LOW (ref 12.0–15.0)
MCH: 29.1 pg (ref 26.0–34.0)
MCHC: 31.4 g/dL (ref 30.0–36.0)
MCV: 92.6 fL (ref 78.0–100.0)
Platelets: 233 10*3/uL (ref 150–400)
RBC: 3.92 MIL/uL (ref 3.87–5.11)
RDW: 14.2 % (ref 11.5–15.5)
WBC: 5.3 10*3/uL (ref 4.0–10.5)

## 2011-09-06 LAB — LIPID PANEL
Cholesterol: 187 mg/dL (ref 0–200)
HDL: 74 mg/dL (ref >39)
LDL Cholesterol: 90 mg/dL (ref 0–99)
Total CHOL/HDL Ratio: 2.5 Ratio
Triglycerides: 116 mg/dL (ref <150)
VLDL: 23 mg/dL (ref 0–40)

## 2011-09-06 LAB — H. PYLORI ANTIBODY, IGG: H Pylori IgG: <0.4 {ISR}

## 2011-09-06 LAB — AMYLASE: Amylase: 51 U/L (ref 0–105)

## 2011-09-07 ENCOUNTER — Telehealth: Payer: Self-pay | Admitting: Family Medicine

## 2011-09-07 NOTE — Telephone Encounter (Signed)
Advised labs are not signed and I can not give her results.  She is understanding.  Advised I will call her tomorrow with results.

## 2011-09-14 ENCOUNTER — Telehealth: Payer: Self-pay

## 2011-09-14 NOTE — Telephone Encounter (Signed)
Pt was advised that this is the MD's half day and was okay to get results on Monday

## 2011-09-14 NOTE — Telephone Encounter (Signed)
Patient called requesting her lab results from last week? Please advise?

## 2011-09-18 ENCOUNTER — Telehealth: Payer: Self-pay

## 2011-09-18 NOTE — Telephone Encounter (Signed)
Repeat to make sure you got the last response?

## 2011-09-18 NOTE — Telephone Encounter (Signed)
Patient informed of the probiotic (states she is already taking) and to try Prilosec/ Omeprazole 20 mg for 2 weeks.

## 2011-09-18 NOTE — Telephone Encounter (Signed)
Make sure she is doing a probiotic and she can add a Prilosec/Omeprazole 20 mg daily if she is not already taking a PPI for the next 2 weeks.

## 2011-09-18 NOTE — Telephone Encounter (Signed)
Left a message for patient to return my call. 

## 2011-09-18 NOTE — Telephone Encounter (Signed)
Pt states she is still burping? Patient states she can drink water and she will burp?  Patient would like to know if there is anything else she can take besides the Zantac? Please advise? Pt was informed of lab results

## 2011-09-18 NOTE — Telephone Encounter (Signed)
So I thought she was coming back in soon but her labs were mostly normal, she does have slight anemia, would recommend increase leafy greens and lean red meat and recheck CBC in roughly 3 month and TSH up very slightly but free T4 is normal so no changes indicated because the Thyroid hormone itself is OK, recheck in 3 months

## 2011-10-06 ENCOUNTER — Ambulatory Visit: Payer: BC Managed Care – PPO | Admitting: Family Medicine

## 2011-12-29 ENCOUNTER — Emergency Department (HOSPITAL_BASED_OUTPATIENT_CLINIC_OR_DEPARTMENT_OTHER): Payer: BC Managed Care – PPO

## 2011-12-29 ENCOUNTER — Encounter (HOSPITAL_BASED_OUTPATIENT_CLINIC_OR_DEPARTMENT_OTHER): Payer: Self-pay

## 2011-12-29 ENCOUNTER — Observation Stay (HOSPITAL_BASED_OUTPATIENT_CLINIC_OR_DEPARTMENT_OTHER)
Admission: EM | Admit: 2011-12-29 | Discharge: 2011-12-30 | DRG: 883 | Disposition: A | Discharge status: Home / Self Care | Payer: BC Managed Care – PPO | Attending: Surgery | Admitting: Surgery

## 2011-12-29 DIAGNOSIS — F329 Major depressive disorder, single episode, unspecified: Secondary | ICD-10-CM

## 2011-12-29 DIAGNOSIS — Z87891 Personal history of nicotine dependence: Secondary | ICD-10-CM

## 2011-12-29 DIAGNOSIS — K37 Unspecified appendicitis: Secondary | ICD-10-CM

## 2011-12-29 DIAGNOSIS — K3580 Unspecified acute appendicitis: Principal | ICD-10-CM | POA: Diagnosis present

## 2011-12-29 DIAGNOSIS — K219 Gastro-esophageal reflux disease without esophagitis: Secondary | ICD-10-CM | POA: Diagnosis present

## 2011-12-29 LAB — CBC
HCT: 35.1 % — ABNORMAL LOW (ref 36.0–46.0)
Hemoglobin: 11.9 g/dL — ABNORMAL LOW (ref 12.0–15.0)
MCH: 30 pg (ref 26.0–34.0)
MCHC: 33.9 g/dL (ref 30.0–36.0)
MCV: 88.4 fL (ref 78.0–100.0)
Platelets: 215 10*3/uL (ref 150–400)
RBC: 3.97 MIL/uL (ref 3.87–5.11)
RDW: 14.2 % (ref 11.5–15.5)
WBC: 15.2 10*3/uL — ABNORMAL HIGH (ref 4.0–10.5)

## 2011-12-29 LAB — URINALYSIS, ROUTINE W REFLEX MICROSCOPIC
Bilirubin Urine: NEGATIVE
Glucose, UA: NEGATIVE mg/dL
Ketones, ur: NEGATIVE mg/dL
Nitrite: NEGATIVE
Protein, ur: NEGATIVE mg/dL
Specific Gravity, Urine: 1.012 (ref 1.005–1.030)
Urobilinogen, UA: 0.2 mg/dL (ref 0.0–1.0)
pH: 5 (ref 5.0–8.0)

## 2011-12-29 LAB — COMPREHENSIVE METABOLIC PANEL
ALT: 16 U/L (ref 0–35)
AST: 21 U/L (ref 0–37)
Albumin: 3.7 g/dL (ref 3.5–5.2)
CO2: 23 mEq/L (ref 19–32)
Chloride: 102 mEq/L (ref 96–112)
GFR calc non Af Amer: >90 mL/min (ref >90)
Potassium: 3.7 mEq/L (ref 3.5–5.1)
Sodium: 137 mEq/L (ref 135–145)
Total Bilirubin: 0.3 mg/dL (ref 0.3–1.2)

## 2011-12-29 LAB — URINE MICROSCOPIC-ADD ON

## 2011-12-29 LAB — PREGNANCY, URINE: Preg Test, Ur: NEGATIVE

## 2011-12-29 MED ORDER — ONDANSETRON HCL 4 MG/2ML IJ SOLN
4.0000 mg | Freq: Once | INTRAMUSCULAR | Status: AC
  Administered 2011-12-29: 4 mg via INTRAVENOUS
  Filled 2011-12-29: qty 2

## 2011-12-29 MED ORDER — HYDROMORPHONE HCL PF 1 MG/ML IJ SOLN
INTRAMUSCULAR | Status: AC
  Administered 2011-12-29: 1 mg
  Filled 2011-12-29: qty 1

## 2011-12-29 MED ORDER — SODIUM CHLORIDE 0.9 % IV BOLUS (SEPSIS)
1000.0000 mL | Freq: Once | INTRAVENOUS | Status: AC
  Administered 2011-12-29: 1000 mL via INTRAVENOUS

## 2011-12-29 MED ORDER — IOHEXOL 300 MG/ML  SOLN
100.0000 mL | Freq: Once | INTRAMUSCULAR | Status: AC | PRN
  Administered 2011-12-29: 100 mL via INTRAVENOUS

## 2011-12-29 MED ORDER — ONDANSETRON HCL 4 MG/2ML IJ SOLN
INTRAMUSCULAR | Status: AC
  Administered 2011-12-29: 23:00:00
  Filled 2011-12-29: qty 2

## 2011-12-29 MED ORDER — SODIUM CHLORIDE 0.9 % IV SOLN
Freq: Once | INTRAVENOUS | Status: AC
  Administered 2011-12-29: 22:00:00 via INTRAVENOUS

## 2011-12-29 MED ORDER — MORPHINE SULFATE 4 MG/ML IJ SOLN
4.0000 mg | Freq: Once | INTRAMUSCULAR | Status: AC
  Administered 2011-12-29: 4 mg via INTRAVENOUS
  Filled 2011-12-29: qty 1

## 2011-12-29 MED ORDER — SODIUM CHLORIDE 0.9 % IV SOLN
1.0000 g | Freq: Once | INTRAVENOUS | Status: AC
  Administered 2011-12-29: 1 g via INTRAVENOUS
  Filled 2011-12-29: qty 1

## 2011-12-29 NOTE — ED Notes (Signed)
WUJ:[id number]<JY> Expected date:12/29/11<BR> Expected time:11:29 PM<BR> Means of arrival:Ambulance<BR> Comments:<BR> Appy from [facility]

## 2011-12-29 NOTE — ED Provider Notes (Addendum)
History     CSN: [id number]  Arrival date & time 12/29/11  1749   First MD Initiated Contact with Patient 12/29/11 1906      Chief Complaint  Patient presents with  . Abdominal Pain    (Consider location/radiation/quality/duration/timing/severity/associated sxs/prior treatment) HPI  C/o abdominal pain today. States that she had onion rings and she began to feel epigastric burning. Min relief with yogurt, no other pain. Began to have severe nausea and lightheadedness shortly thereafter. +NBNB emesis x one. States pain is 6/10 at this time. Denies diarrhea/constipation. States pain has now moved to RLQ. Denies vag discharge. Denies hematuria/dysuria/freq/urgency. Denies fever +Chills.  Dec appetite   Past Medical History  Diagnosis Date  . GERD (gastroesophageal reflux disease)   . Anxiety   . Allergy   . Constipation   . Anemia     with pregnancy  . History of renal stone   . Anxiety and depression     Past Surgical History  Procedure Date  . Lithotripsy     once during pregnancy  . Cystoscopy w/ ureteral stent placement     and removal    Family History  Problem Relation Age of Onset  . Thyroid disease Mother   . Other Mother     uterine prolapse  . Scoliosis Mother   . Thyroid disease Father   . Thyroid disease Brother   . COPD Maternal Grandmother   . Cancer Maternal Grandfather     colon cancer  . Scoliosis Sister   . Scoliosis Sister     History  Substance Use Topics  . Smoking status: Former Games developer  . Smokeless tobacco: Not on file  . Alcohol Use: Yes    OB History    Grav Para Term Preterm Abortions TAB SAB Ect Mult Living                  Review of Systems  All other systems reviewed and are negative.  except as noted HPI   Allergies  Review of patient's allergies indicates no known allergies.  Home Medications   Current Outpatient Rx  Name Route Sig Dispense Refill  . ACETAMINOPHEN 500 MG PO TABS Oral Take 1,000 mg by mouth  every 6 (six) hours as needed. Patient used this medication for her headache.    . IBUPROFEN 200 MG PO TABS Oral Take 400 mg by mouth every 6 (six) hours as needed. Patient took this medication for menstrual cramps.    . ALPRAZOLAM 0.25 MG PO TABS  1/2 to 2 tabs daily as needed 20 tablet 1  . ETONOGESTREL-ETHINYL ESTRADIOL 0.12-0.015 MG/24HR VA RING Vaginal Place 1 each vaginally every 28 (twenty-eight) days. Insert vaginally and leave in place for 3 consecutive weeks, then remove for 1 week.      BP 106/60  Pulse 54  Temp 98.6 F (37 C) (Oral)  Resp 16  Ht 5\' 2"  (1.575 m)  Wt 106 lb (48.081 kg)  BMI 19.39 kg/m2  SpO2 100%  LMP 12/20/2011  Physical Exam  Nursing note and vitals reviewed. Constitutional: She is oriented to person, place, and time. She appears well-developed.  HENT:  Head: Atraumatic.  Mouth/Throat: Oropharynx is clear and moist.  Eyes: Conjunctivae and EOM are normal. Pupils are equal, round, and reactive to light.  Neck: Normal range of motion. Neck supple.  Cardiovascular: Normal rate, regular rhythm, normal heart sounds and intact distal pulses.   Pulmonary/Chest: Effort normal and breath sounds normal. No respiratory distress. She has  no wheezes. She has no rales.  Abdominal: Soft. She exhibits no distension. There is tenderness. There is guarding. There is no rebound.       Diffuse abdominal pain > RLQ with gaurding RLQ  Musculoskeletal: Normal range of motion.  Neurological: She is alert and oriented to person, place, and time.  Skin: Skin is warm and dry. No rash noted.  Psychiatric: She has a normal mood and affect.    ED Course  Procedures (including critical care time)  Labs Reviewed  URINALYSIS, ROUTINE W REFLEX MICROSCOPIC - Abnormal; Notable for the following:    APPearance CLOUDY (*)     Hgb urine dipstick TRACE (*)     Leukocytes, UA TRACE (*)     All other components within normal limits  URINE MICROSCOPIC-ADD ON - Abnormal; Notable for the  following:    Squamous Epithelial / LPF FEW (*)     Bacteria, UA FEW (*)     All other components within normal limits  CBC - Abnormal; Notable for the following:    WBC 15.2 (*)     Hemoglobin 11.9 (*)     HCT 35.1 (*)     All other components within normal limits  COMPREHENSIVE METABOLIC PANEL - Abnormal; Notable for the following:    Glucose, Bld 121 (*)     All other components within normal limits  PREGNANCY, URINE  LIPASE, BLOOD   Ct Abdomen Pelvis W Contrast  12/29/2011  *RADIOLOGY REPORT*  Clinical Data: Right lower quadrant pain.  Vomiting.  Elevated white count.  CT ABDOMEN AND PELVIS WITH CONTRAST  Technique:  Multidetector CT imaging of the abdomen and pelvis was performed following the standard protocol during bolus administration of intravenous contrast.  Contrast: OMNIPAQUE IOHEXOL 300 MG/ML  SOLN  Comparison: 06/02/2006.  Findings: The appendix appears slightly prominent and hyperemic which in the present clinical setting raises possibility of appendicitis without rupture or surrounding extraluminal inflammation.  Minimal prominence intrahepatic biliary ducts.  This may be normal for this patient in the setting of normal laboratory liver function studies.  Elongated liver extending into the pelvis spanning over 22.8 cm. 6 mm nonspecific lesion inferior aspect of the right lobe liver probably a cyst although too small to adequately characterize.  No focal splenic, adrenal, renal or pancreatic lesion.  No calcified gallstones.  Lung bases clear.  No abdominal aortic aneurysm.  Scoliosis without bony destructive lesion.  Prominent periuterine vessels probably normal.  Decompressed under filled urinary bladder without gross abnormality.  No gross abnormality along the adnexa.  IMPRESSION: The appendix appears slightly prominent and hyperemic which in the present clinical setting raises possibility of appendicitis without rupture or surrounding extraluminal inflammation.  Minimal  prominence intrahepatic biliary ducts.  This may be normal for this patient in the setting of normal laboratory liver function studies.  Elongated liver extending into the pelvis spanning over 22.8 cm.  Critical Value/emergent results were called by telephone at the time of interpretation on 12/29/2011 at 9:58 p.m. to Dr. Hyman Hopes, who verbally acknowledged these results.  Original Report Authenticated By: Fuller Canada, M.D.   1. Appendicitis     MDM  Epigastric-->RLQ pain. No R/G at this time. Leukocytosis. Concern for appendicitis by CT A/P. No RUQ pain or abnl LFTs. Contaminated urine sample without sig amt WBCs.  D/W Patient. D/W Gen surgery Dr. Arn Medal-- Transfer to [facility] for evaluation. Ertapenem ordered. IVF, morphine, zofran. Discussed transfer with Dr. Jeraldine Loots.  Forbes Cellar, MD 12/29/11 1610  Forbes Cellar, MD 12/29/11 2236

## 2011-12-29 NOTE — ED Notes (Signed)
MD at bedside. 

## 2011-12-29 NOTE — ED Notes (Signed)
C/o abd pain started 115pm-vomiting once approx 515pm-states pain is now localized to RLQ

## 2011-12-30 ENCOUNTER — Encounter (HOSPITAL_COMMUNITY): Payer: Self-pay | Admitting: Anesthesiology

## 2011-12-30 ENCOUNTER — Emergency Department (HOSPITAL_COMMUNITY): Payer: BC Managed Care – PPO | Admitting: Anesthesiology

## 2011-12-30 ENCOUNTER — Encounter (HOSPITAL_COMMUNITY)
Admission: EM | Disposition: A | Discharge status: Home / Self Care | Payer: Self-pay | Source: Home / Self Care | Attending: Emergency Medicine

## 2011-12-30 DIAGNOSIS — K3580 Unspecified acute appendicitis: Secondary | ICD-10-CM

## 2011-12-30 HISTORY — PX: LAPAROSCOPIC APPENDECTOMY: SHX408

## 2011-12-30 SURGERY — APPENDECTOMY, LAPAROSCOPIC
Anesthesia: General | Site: Abdomen | Wound class: Contaminated

## 2011-12-30 MED ORDER — HYDROMORPHONE HCL PF 1 MG/ML IJ SOLN
INTRAMUSCULAR | Status: AC
  Filled 2011-12-30: qty 1

## 2011-12-30 MED ORDER — BUPIVACAINE-EPINEPHRINE 0.5% -1:200000 IJ SOLN
INTRAMUSCULAR | Status: AC
  Filled 2011-12-30: qty 1

## 2011-12-30 MED ORDER — OXYCODONE-ACETAMINOPHEN 5-325 MG PO TABS: 1.0000 | ORAL_TABLET | ORAL | Status: AC | PRN

## 2011-12-30 MED ORDER — PHENYLEPHRINE HCL 10 MG/ML IJ SOLN
INTRAMUSCULAR | Status: DC | PRN
  Administered 2011-12-30 (×3): 80 ug via INTRAVENOUS

## 2011-12-30 MED ORDER — LIDOCAINE HCL (CARDIAC) 20 MG/ML IV SOLN
INTRAVENOUS | Status: DC | PRN
  Administered 2011-12-30: 40 mg via INTRAVENOUS

## 2011-12-30 MED ORDER — ALPRAZOLAM 0.25 MG PO TABS: 0.2500 mg | ORAL_TABLET | Freq: Three times a day (TID) | ORAL | Status: DC | PRN

## 2011-12-30 MED ORDER — PROPOFOL 10 MG/ML IV EMUL
INTRAVENOUS | Status: DC | PRN
  Administered 2011-12-30: 150 mg via INTRAVENOUS

## 2011-12-30 MED ORDER — HYDROMORPHONE HCL PF 1 MG/ML IJ SOLN
0.2500 mg | INTRAMUSCULAR | Status: DC | PRN
  Administered 2011-12-30: 0.5 mg via INTRAVENOUS

## 2011-12-30 MED ORDER — ONDANSETRON HCL 4 MG PO TABS: 4.0000 mg | ORAL_TABLET | Freq: Four times a day (QID) | ORAL | Status: DC | PRN

## 2011-12-30 MED ORDER — HEPARIN SODIUM (PORCINE) 5000 UNIT/ML IJ SOLN
5000.0000 [IU] | Freq: Three times a day (TID) | INTRAMUSCULAR | Status: DC
  Administered 2011-12-30: 5000 [IU] via SUBCUTANEOUS
  Filled 2011-12-30 (×4): qty 1

## 2011-12-30 MED ORDER — PROMETHAZINE HCL 25 MG/ML IJ SOLN: 6.2500 mg | INTRAMUSCULAR | Status: DC | PRN

## 2011-12-30 MED ORDER — SUCCINYLCHOLINE CHLORIDE 20 MG/ML IJ SOLN
INTRAMUSCULAR | Status: DC | PRN
  Administered 2011-12-30: 80 mg via INTRAVENOUS

## 2011-12-30 MED ORDER — MIDAZOLAM HCL 5 MG/5ML IJ SOLN
INTRAMUSCULAR | Status: DC | PRN
  Administered 2011-12-30: 1 mg via INTRAVENOUS
  Administered 2011-12-30: 2 mg via INTRAVENOUS

## 2011-12-30 MED ORDER — MORPHINE SULFATE 2 MG/ML IJ SOLN
2.0000 mg | INTRAMUSCULAR | Status: DC | PRN
  Administered 2011-12-30: 2 mg via INTRAVENOUS
  Administered 2011-12-30: 4 mg via INTRAVENOUS
  Filled 2011-12-30 (×2): qty 2
  Filled 2011-12-30: qty 1

## 2011-12-30 MED ORDER — LACTATED RINGERS IV SOLN
INTRAVENOUS | Status: DC | PRN
  Administered 2011-12-30 (×2): via INTRAVENOUS

## 2011-12-30 MED ORDER — LACTATED RINGERS IR SOLN
Status: DC | PRN
  Administered 2011-12-30: 100 mL

## 2011-12-30 MED ORDER — KCL IN DEXTROSE-NACL 20-5-0.9 MEQ/L-%-% IV SOLN
INTRAVENOUS | Status: DC
  Filled 2011-12-30 (×2): qty 1000

## 2011-12-30 MED ORDER — ROCURONIUM BROMIDE 100 MG/10ML IV SOLN
INTRAVENOUS | Status: DC | PRN
  Administered 2011-12-30: 30 mg via INTRAVENOUS

## 2011-12-30 MED ORDER — OXYCODONE-ACETAMINOPHEN 5-325 MG PO TABS
1.0000 | ORAL_TABLET | ORAL | Status: DC | PRN
  Administered 2011-12-30 (×2): 1 via ORAL
  Filled 2011-12-30 (×2): qty 1

## 2011-12-30 MED ORDER — BUPIVACAINE-EPINEPHRINE 0.5% -1:200000 IJ SOLN
INTRAMUSCULAR | Status: DC | PRN
  Administered 2011-12-30: 26 mL

## 2011-12-30 MED ORDER — NEOSTIGMINE METHYLSULFATE 1 MG/ML IJ SOLN
INTRAMUSCULAR | Status: DC | PRN
  Administered 2011-12-30: .4 mg via INTRAVENOUS

## 2011-12-30 MED ORDER — SUFENTANIL CITRATE 50 MCG/ML IV SOLN
INTRAVENOUS | Status: DC | PRN
  Administered 2011-12-30: 5 ug via INTRAVENOUS
  Administered 2011-12-30: 10 ug via INTRAVENOUS
  Administered 2011-12-30: 5 ug via INTRAVENOUS

## 2011-12-30 MED ORDER — DEXAMETHASONE SODIUM PHOSPHATE 10 MG/ML IJ SOLN
INTRAMUSCULAR | Status: DC | PRN
  Administered 2011-12-30: 10 mg via INTRAVENOUS

## 2011-12-30 MED ORDER — ONDANSETRON HCL 4 MG/2ML IJ SOLN: 4.0000 mg | Freq: Four times a day (QID) | INTRAMUSCULAR | Status: DC | PRN

## 2011-12-30 MED ORDER — ONDANSETRON HCL 4 MG/2ML IJ SOLN
INTRAMUSCULAR | Status: DC | PRN
  Administered 2011-12-30: 4 mg via INTRAVENOUS

## 2011-12-30 SURGICAL SUPPLY — 41 items
APPLIER CLIP 5 13 M/L LIGAMAX5 (MISCELLANEOUS)
APPLIER CLIP ROT 10 11.4 M/L (STAPLE)
CANISTER SUCTION 2500CC (MISCELLANEOUS) ×2 IMPLANT
CHLORAPREP W/TINT 26ML (MISCELLANEOUS) ×2 IMPLANT
CLIP APPLIE 5 13 M/L LIGAMAX5 (MISCELLANEOUS) IMPLANT
CLIP APPLIE ROT 10 11.4 M/L (STAPLE) IMPLANT
CLOTH BEACON ORANGE TIMEOUT ST (SAFETY) ×2 IMPLANT
CUTTER FLEX LINEAR 45M (STAPLE) ×2 IMPLANT
DECANTER SPIKE VIAL GLASS SM (MISCELLANEOUS) ×2 IMPLANT
DERMABOND ADVANCED (GAUZE/BANDAGES/DRESSINGS) ×2
DERMABOND ADVANCED .7 DNX12 (GAUZE/BANDAGES/DRESSINGS) ×2 IMPLANT
DRAPE LAPAROSCOPIC ABDOMINAL (DRAPES) ×2 IMPLANT
ELECT REM PT RETURN 9FT ADLT (ELECTROSURGICAL) ×2
ELECTRODE REM PT RTRN 9FT ADLT (ELECTROSURGICAL) ×1 IMPLANT
GLOVE BIOGEL PI IND STRL 7.0 (GLOVE) ×3 IMPLANT
GLOVE BIOGEL PI INDICATOR 7.0 (GLOVE) ×3
GLOVE SS BIOGEL STRL SZ 7.5 (GLOVE) ×1 IMPLANT
GLOVE SUPERSENSE BIOGEL SZ 7.5 (GLOVE) ×1
GOWN STRL NON-REIN LRG LVL3 (GOWN DISPOSABLE) ×2 IMPLANT
GOWN STRL REIN XL XLG (GOWN DISPOSABLE) ×4 IMPLANT
IV LACTATED RINGERS 1000ML (IV SOLUTION) ×2 IMPLANT
KIT BASIN OR (CUSTOM PROCEDURE TRAY) ×2 IMPLANT
NS IRRIG 1000ML POUR BTL (IV SOLUTION) IMPLANT
PENCIL BUTTON HOLSTER BLD 10FT (ELECTRODE) IMPLANT
POUCH SPECIMEN RETRIEVAL 10MM (ENDOMECHANICALS) ×2 IMPLANT
RELOAD 45 VASCULAR/THIN (ENDOMECHANICALS) ×2 IMPLANT
RELOAD STAPLE TA45 3.5 REG BLU (ENDOMECHANICALS) IMPLANT
SCALPEL HARMONIC ACE (MISCELLANEOUS) ×2 IMPLANT
SET IRRIG TUBING LAPAROSCOPIC (IRRIGATION / IRRIGATOR) ×2 IMPLANT
SLEEVE Z-THREAD 5X100MM (TROCAR) IMPLANT
SOLUTION ANTI FOG 6CC (MISCELLANEOUS) ×2 IMPLANT
STRIP CLOSURE SKIN 1/2X4 (GAUZE/BANDAGES/DRESSINGS) ×2 IMPLANT
SUT MNCRL AB 4-0 PS2 18 (SUTURE) ×2 IMPLANT
SUT VICRYL 0 UR6 27IN ABS (SUTURE) ×2 IMPLANT
TOWEL OR 17X26 10 PK STRL BLUE (TOWEL DISPOSABLE) ×2 IMPLANT
TRAY FOLEY CATH 14FRSI W/METER (CATHETERS) ×2 IMPLANT
TRAY LAP CHOLE (CUSTOM PROCEDURE TRAY) ×2 IMPLANT
TROCAR XCEL BLUNT TIP 100MML (ENDOMECHANICALS) ×2 IMPLANT
TROCAR Z-THREAD FIOS 12X100MM (TROCAR) ×2 IMPLANT
TROCAR Z-THREAD FIOS 5X100MM (TROCAR) ×2 IMPLANT
TUBING INSUFFLATION 10FT LAP (TUBING) ×2 IMPLANT

## 2011-12-30 NOTE — Transfer of Care (Signed)
Immediate Anesthesia Transfer of Care Note  Patient: Nancy Higgins  Procedure(s) Performed: Procedure(s) (LRB): APPENDECTOMY LAPAROSCOPIC (N/A)  Patient Location: PACU  Anesthesia Type: General  Level of Consciousness: awake, alert , oriented and patient cooperative  Airway & Oxygen Therapy: Patient Spontanous Breathing and Patient connected to face mask oxygen  Post-op Assessment: Report given to PACU RN, Post -op Vital signs reviewed and stable and Patient moving all extremities X 4  Post vital signs: stable  Complications: No apparent anesthesia complications

## 2011-12-30 NOTE — Anesthesia Postprocedure Evaluation (Signed)
  Anesthesia Post-op Note  Patient: Nancy Higgins  Procedure(s) Performed: Procedure(s) (LRB): APPENDECTOMY LAPAROSCOPIC (N/A)  Patient Location: PACU  Anesthesia Type: General  Level of Consciousness: awake and alert   Airway and Oxygen Therapy: Patient Spontanous Breathing  Post-op Pain: mild  Post-op Assessment: Post-op Vital signs reviewed, Patient's Cardiovascular Status Stable, Respiratory Function Stable, Patent Airway and No signs of Nausea or vomiting  Post-op Vital Signs: stable  Complications: No apparent anesthesia complications

## 2011-12-30 NOTE — Progress Notes (Signed)
Pt discharged to home with husband provided discharge instructions and prescriptions along with handouts. Pt verbalized understanding of discharge information. Pt stable. Pt transported by Asher Muir IV removed and documented. Annitta Needs, RN

## 2011-12-30 NOTE — Progress Notes (Signed)
Pt transferred to room 1524 via stretcher from PACU-VS 97.6,50,17,95/73, 97% RA

## 2011-12-30 NOTE — Discharge Summary (Signed)
   Patient ID: Nancy Higgins [id number] 35 y.o. Oct 16, 1975  12/29/2011  Discharge date and time: 12/30/2011   Admitting Physician: Glenna Fellows T  Discharge Physician: Glenna Fellows T  Admission Diagnoses: Appendicitis [541] rt side stomach pain  Discharge Diagnoses: same  Operations: Procedure(s): APPENDECTOMY LAPAROSCOPIC  Admission Condition: fair  Discharged Condition: good  Indication for Admission: patient is a 36 year old female who presented with typical symptoms and physical findings for appendicitis. CT scan was of concern for early appendicitis.  Hospital Course: the patient was admitted, given broad-spectrum IV antibiotics and underwent urgent laparoscopic appendectomy with findings of early acute appendicitis. Later in the day her pain was well-controlled and she is tolerating her diet. Abdomen is benign and wounds clean. She is ready for discharge.  Disposition: Home  Patient Instructions:   [place], [place]  Home Medication Instructions [id number]   Printed on:12/30/11 0954  Medication Information                    etonogestrel-ethinyl estradiol (NUVARING) 0.12-0.015 MG/24HR vaginal ring Place 1 each vaginally every 28 (twenty-eight) days. Insert vaginally and leave in place for 3 consecutive weeks, then remove for 1 week.           ALPRAZolam (XANAX) 0.25 MG tablet 1/2 to 2 tabs daily as needed           acetaminophen (TYLENOL) 500 MG tablet Take 1,000 mg by mouth every 6 (six) hours as needed. Patient used this medication for her headache.           ibuprofen (ADVIL,MOTRIN) 200 MG tablet Take 400 mg by mouth every 6 (six) hours as needed. Patient took this medication for menstrual cramps.           oxyCODONE-acetaminophen (PERCOCET) 5-325 MG per tablet Take 1-2 tablets by mouth every 4 (four) hours as needed.             Activity: activity as tolerated Diet: regular diet Wound Care: none needed  Follow-up:  With Dr. Johna Sheriff  in 3 weeks.  Signed: Mariella Saa MD, FACS  12/30/2011, 9:54 AM

## 2011-12-30 NOTE — Addendum Note (Signed)
Addendum  created 12/30/11 0521 by Illene Silver, CRNA   Modules edited:Anesthesia Medication Administration

## 2011-12-30 NOTE — ED Notes (Signed)
Pt transported to OR

## 2011-12-30 NOTE — Addendum Note (Signed)
Addendum  created 12/30/11 0521 by Breckyn Troyer E Josiephine Simao, CRNA   Modules edited:Anesthesia Medication Administration    

## 2011-12-30 NOTE — Plan of Care (Signed)
Problem: Phase I Progression Outcomes Goal: Voiding-avoid urinary catheter unless indicated Outcome: Not Met (add Reason) Pt voided small amt of urine in measuring hat in toilet- states she feels pressure

## 2011-12-30 NOTE — H&P (Signed)
Nancy Higgins is an 36 y.o. female.   Chief Complaint: Abdominal pain HPI: The patient is a 36 year old female who was in her usual state of health until approximately 12 hours ago. At that time she began to have some generalized abdominal discomfort described as burning and some nausea. This worsened and she developed vomiting and then became very lightheaded. She called 911 and was taken to the emergency department. While there were generalized abdominal pain became gradually localized to the right lower quadrant. It has persisted they are unchanged. She still has pain despite medication. It is worse with any motion. She remains nauseated. She denies any similar previous symptoms or chronic abdominal war GI complaints.  Past Medical History  Diagnosis Date  . GERD (gastroesophageal reflux disease)   . Anxiety   . Allergy   . Constipation   . Anemia     with pregnancy  . History of renal stone   . Anxiety and depression     Past Surgical History  Procedure Date  . Lithotripsy     once during pregnancy  . Cystoscopy w/ ureteral stent placement     and removal    Family History  Problem Relation Age of Onset  . Thyroid disease Mother   . Other Mother     uterine prolapse  . Scoliosis Mother   . Thyroid disease Father   . Thyroid disease Brother   . COPD Maternal Grandmother   . Cancer Maternal Grandfather     colon cancer  . Scoliosis Sister   . Scoliosis Sister    Current Facility-Administered Medications  Medication Dose Route Frequency Provider Last Rate Last Dose  . 0.9 %  sodium chloride infusion   Intravenous Once Forbes Cellar, MD 125 mL/hr at 12/29/11 2215    . ertapenem (INVANZ) 1 g in sodium chloride 0.9 % 50 mL IVPB  1 g Intravenous Once Forbes Cellar, MD   1 g at 12/29/11 2227  . HYDROmorphone (DILAUDID) 1 MG/ML injection        1 mg at 12/29/11 2314  . iohexol (OMNIPAQUE) 300 MG/ML solution 100 mL  100 mL Intravenous Once PRN Medication Radiologist, MD    100 mL at 12/29/11 2142  . morphine 4 MG/ML injection 4 mg  4 mg Intravenous Once Forbes Cellar, MD   4 mg at 12/29/11 1921  . morphine 4 MG/ML injection 4 mg  4 mg Intravenous Once Forbes Cellar, MD   4 mg at 12/29/11 2217  . ondansetron (ZOFRAN) 4 MG/2ML injection           . ondansetron (ZOFRAN) injection 4 mg  4 mg Intravenous Once Forbes Cellar, MD   4 mg at 12/29/11 1921  . sodium chloride 0.9 % bolus 1,000 mL  1,000 mL Intravenous Once Forbes Cellar, MD   1,000 mL at 12/29/11 1921   Current Outpatient Prescriptions  Medication Sig Dispense Refill  . acetaminophen (TYLENOL) 500 MG tablet Take 1,000 mg by mouth every 6 (six) hours as needed. Patient used this medication for her headache.      . ibuprofen (ADVIL,MOTRIN) 200 MG tablet Take 400 mg by mouth every 6 (six) hours as needed. Patient took this medication for menstrual cramps.      . ALPRAZolam (XANAX) 0.25 MG tablet 1/2 to 2 tabs daily as needed  20 tablet  1  . etonogestrel-ethinyl estradiol (NUVARING) 0.12-0.015 MG/24HR vaginal ring Place 1 each vaginally every 28 (twenty-eight) days. Insert vaginally and leave in place for  3 consecutive weeks, then remove for 1 week.       Social History:  reports that she has quit smoking. She does not have any smokeless tobacco history on file. She reports that she drinks alcohol. She reports that she does not use illicit drugs.  Allergies: No Known Allergies     Results for orders placed during the hospital encounter of 12/29/11 (from the past 48 hour(s))  URINALYSIS, ROUTINE W REFLEX MICROSCOPIC     Status: Abnormal   Collection Time   12/29/11  6:01 PM      Component Value Range Comment   Color, Urine YELLOW  YELLOW    APPearance CLOUDY (*) CLEAR    Specific Gravity, Urine 1.012  1.005 - 1.030    pH 5.0  5.0 - 8.0    Glucose, UA NEGATIVE  NEGATIVE mg/dL    Hgb urine dipstick TRACE (*) NEGATIVE    Bilirubin Urine NEGATIVE  NEGATIVE    Ketones, ur NEGATIVE  NEGATIVE mg/dL     Protein, ur NEGATIVE  NEGATIVE mg/dL    Urobilinogen, UA 0.2  0.0 - 1.0 mg/dL    Nitrite NEGATIVE  NEGATIVE    Leukocytes, UA TRACE (*) NEGATIVE   PREGNANCY, URINE     Status: Normal   Collection Time   12/29/11  6:01 PM      Component Value Range Comment   Preg Test, Ur NEGATIVE  NEGATIVE   URINE MICROSCOPIC-ADD ON     Status: Abnormal   Collection Time   12/29/11  6:01 PM      Component Value Range Comment   Squamous Epithelial / LPF FEW (*) RARE    WBC, UA 0-2  <3 WBC/hpf    RBC / HPF 0-2  <3 RBC/hpf    Bacteria, UA FEW (*) RARE   CBC     Status: Abnormal   Collection Time   12/29/11  6:57 PM      Component Value Range Comment   WBC 15.2 (*) 4.0 - 10.5 K/uL    RBC 3.97  3.87 - 5.11 MIL/uL    Hemoglobin 11.9 (*) 12.0 - 15.0 g/dL    HCT 29.5 (*) 62.1 - 46.0 %    MCV 88.4  78.0 - 100.0 fL    MCH 30.0  26.0 - 34.0 pg    MCHC 33.9  30.0 - 36.0 g/dL    RDW 30.8  65.7 - 84.6 %    Platelets 215  150 - 400 K/uL   COMPREHENSIVE METABOLIC PANEL     Status: Abnormal   Collection Time   12/29/11  6:57 PM      Component Value Range Comment   Sodium 137  135 - 145 mEq/L    Potassium 3.7  3.5 - 5.1 mEq/L    Chloride 102  96 - 112 mEq/L    CO2 23  19 - 32 mEq/L    Glucose, Bld 121 (*) 70 - 99 mg/dL    BUN 8  6 - 23 mg/dL    Creatinine, Ser 9.62  0.50 - 1.10 mg/dL    Calcium 9.3  8.4 - 95.2 mg/dL    Total Protein 6.9  6.0 - 8.3 g/dL    Albumin 3.7  3.5 - 5.2 g/dL    AST 21  0 - 37 U/L    ALT 16  0 - 35 U/L    Alkaline Phosphatase 48  39 - 117 U/L    Total Bilirubin 0.3  0.3 -  1.2 mg/dL    GFR calc non Af Amer >90  >90 mL/min    GFR calc Af Amer >90  >90 mL/min   LIPASE, BLOOD     Status: Normal   Collection Time   12/29/11  7:14 PM      Component Value Range Comment   Lipase 38  11 - 59 U/L    Ct Abdomen Pelvis W Contrast  12/29/2011  *RADIOLOGY REPORT*  Clinical Data: Right lower quadrant pain.  Vomiting.  Elevated white count.  CT ABDOMEN AND PELVIS WITH CONTRAST  Technique:   Multidetector CT imaging of the abdomen and pelvis was performed following the standard protocol during bolus administration of intravenous contrast.  Contrast: OMNIPAQUE IOHEXOL 300 MG/ML  SOLN  Comparison: 06/02/2006.  Findings: The appendix appears slightly prominent and hyperemic which in the present clinical setting raises possibility of appendicitis without rupture or surrounding extraluminal inflammation.  Minimal prominence intrahepatic biliary ducts.  This may be normal for this patient in the setting of normal laboratory liver function studies.  Elongated liver extending into the pelvis spanning over 22.8 cm. 6 mm nonspecific lesion inferior aspect of the right lobe liver probably a cyst although too small to adequately characterize.  No focal splenic, adrenal, renal or pancreatic lesion.  No calcified gallstones.  Lung bases clear.  No abdominal aortic aneurysm.  Scoliosis without bony destructive lesion.  Prominent periuterine vessels probably normal.  Decompressed under filled urinary bladder without gross abnormality.  No gross abnormality along the adnexa.  IMPRESSION: The appendix appears slightly prominent and hyperemic which in the present clinical setting raises possibility of appendicitis without rupture or surrounding extraluminal inflammation.  Minimal prominence intrahepatic biliary ducts.  This may be normal for this patient in the setting of normal laboratory liver function studies.  Elongated liver extending into the pelvis spanning over 22.8 cm.  Critical Value/emergent results were called by telephone at the time of interpretation on 12/29/2011 at 9:58 p.m. to Dr. Hyman Hopes, who verbally acknowledged these results.  Original Report Authenticated By: Fuller Canada, M.D.    Review of Systems  Constitutional: Positive for chills and diaphoresis. Negative for fever.  Respiratory: Negative.   Cardiovascular: Negative.   Gastrointestinal: Positive for nausea, vomiting and abdominal  pain. Negative for diarrhea and constipation.  Genitourinary: Negative for dysuria, urgency and frequency.    Blood pressure 106/60, pulse 54, temperature 98.6 F (37 C), temperature source Oral, resp. rate 16, height 5\' 2"  (1.575 m), weight 106 lb (48.081 kg), last menstrual period 12/20/2011, SpO2 100.00%. Physical Exam  Gen.: Well-developed Caucasian female in no acute distress Skin: Warm and dry without rash or infection HEENT: No palpable masses. Sclera nonicteric. Warfarin is clear. Lymph nodes: No palpable cervical, subclavicular or inguinal lymph nodes Lungs: Clear equal breath sounds bilaterally without wheezing or increased work of breathing Cardiovascular: Regular rate  And rhythm without murmur. Abdomen: Nondistended. Bowel sounds are present but hypoactive. There is well localized right lower quadrant tenderness with guarding. No discernible masses or organomegaly. Extremities: No joint swelling or edema Neurologic: Alert and fully oriented. No gross motor deficits.  Assessment/Plan Likely acute appendicitis. She has classic symptoms and localized right lower quadrant tenderness with elevated white count. CT scan is not clearly diagnostic for appendix but does show abnormalities in the appendix with hyperemia and some dilation. I recommend proceeding with laparoscopic appendectomy. We discussed the indications for surgery, teenager, recovery, and risks of anesthetic complications, bleeding, infection, and possible open incision. She understands  and agrees.  Nancy Higgins 12/30/2011, 1:42 AM

## 2011-12-30 NOTE — Anesthesia Preprocedure Evaluation (Signed)
Anesthesia Evaluation  Patient identified by MRN, date of birth, ID band Patient awake    Reviewed: Allergy & Precautions, H&P , NPO status , Patient's Chart, lab work & pertinent test results, reviewed documented beta blocker date and time   Airway Mallampati: II TM Distance: >3 FB Neck ROM: full    Dental No notable dental hx.    Pulmonary neg pulmonary ROS, former smoker,  breath sounds clear to auscultation  Pulmonary exam normal       Cardiovascular Exercise Tolerance: Good negative cardio ROS  Rhythm:regular Rate:Normal     Neuro/Psych PSYCHIATRIC DISORDERS negative neurological ROS     GI/Hepatic negative GI ROS, Neg liver ROS, GERD-  Medicated,  Endo/Other  negative endocrine ROS  Renal/GU negative Renal ROS  negative genitourinary   Musculoskeletal   Abdominal   Peds  Hematology negative hematology ROS (+)   Anesthesia Other Findings   Reproductive/Obstetrics negative OB ROS                           Anesthesia Physical Anesthesia Plan  ASA: II  Anesthesia Plan: General ETT   Post-op Pain Management:    Induction:   Airway Management Planned:   Additional Equipment:   Intra-op Plan:   Post-operative Plan:   Informed Consent: I have reviewed the patients History and Physical, chart, labs and discussed the procedure including the risks, benefits and alternatives for the proposed anesthesia with the patient or authorized representative who has indicated his/her understanding and acceptance.   Dental Advisory Given  Plan Discussed with: CRNA  Anesthesia Plan Comments:         Anesthesia Quick Evaluation

## 2011-12-30 NOTE — Op Note (Signed)
Preoperative Diagnosis: Appendicitis [541] rt side stomach pain  Postoprative Diagnosis: Appendicitis [541] rt side stomach pain  Procedure: Procedure(s): APPENDECTOMY LAPAROSCOPIC   Surgeon: Glenna Fellows T   Assistants: None  Anesthesia:  General endotracheal anesthesiaDiagnos  Indications:  Patient is a 36 year old female who presents with typical presentation of epigastric and then right lower quadrant abdominal pain, nausea and vomiting with elevated white count and CT scan showing evidence of early appendicitis. I recommended proceeding with laparoscopic appendectomy. We discussed the indications and risks detailed elsewhere.   Procedure Detail:  Patient brought to the operating room, placed in supine position on the operating table, and general ventricular anesthesia induced. Foley catheter was placed. The abdomen was widely sterilely prepped and draped. Patient timeout was performed and the procedure verified. She had received broad-spectrum preoperative IV antibiotics. Local anesthesia was used to infiltrate the trocar sites. Access was obtained 1 cm infraumbilical incision with an open Hassan technique the mattress suture of 0 Vicryl and pneumoperitoneum established. Under direct vision a 5 mm trocar was placed in the right upper quadrant and a 12 mm trocar in the left lower quadrant. The appendix was exposed and appeared abnormal with erythema that was marked but no exudate or gangrenous changes or evidence of perforation. The appendix was elevated it was very mobile. The mesial appendix was sequentially divided with the harmonic scalpel until the appendix was completely free down to its base. The appendix was divided across the tip of the cecum with a single firing of the Endo GIA 45 mm white load stapler. The staple line was intact and without bleeding. The appendix was removed through the umbilical incision with an Endo Catch bag. The mattress suture was secured at the  umbilicus. There was no evidence of bleeding or injury or other problems. All CO2 was evacuated and trochars removed. A single 0 Vicryl suture was placed in the anterior fashion the left lower quadrant incision. Skin incisions were closed with subcuticular Monocryl and Dermabond appear  Findings: Acute appendicitis without perforation or gangrene         Specimens: Appendix        Complications:  * No complications entered in OR log *         Disposition: PACU - hemodynamically stable.         Condition: stable  Mariella Saa MD, FACS  12/30/2011, 3:30 AM

## 2012-01-01 ENCOUNTER — Encounter (HOSPITAL_COMMUNITY): Payer: Self-pay | Admitting: General Surgery

## 2012-01-04 ENCOUNTER — Other Ambulatory Visit (INDEPENDENT_AMBULATORY_CARE_PROVIDER_SITE_OTHER): Payer: Self-pay

## 2012-01-04 ENCOUNTER — Telehealth (INDEPENDENT_AMBULATORY_CARE_PROVIDER_SITE_OTHER): Payer: Self-pay

## 2012-01-04 ENCOUNTER — Telehealth (INDEPENDENT_AMBULATORY_CARE_PROVIDER_SITE_OTHER): Payer: Self-pay | Admitting: General Surgery

## 2012-01-04 DIAGNOSIS — G8918 Other acute postprocedural pain: Secondary | ICD-10-CM

## 2012-01-04 MED ORDER — HYDROCODONE-ACETAMINOPHEN 5-325 MG PO TABS: 1.0000 | ORAL_TABLET | Freq: Four times a day (QID) | ORAL | Status: DC | PRN

## 2012-01-04 NOTE — Telephone Encounter (Signed)
Refill protocol -- Hydrocodone 5/325mg , 1 po q 6hrs prn pain, #30 0 refills.  [facility] in [place] [phone].  Patient given her follow up appointment for 01/12/12 @ 4:45pm w/Dr. Johna Sheriff.

## 2012-01-04 NOTE — Telephone Encounter (Signed)
Patient is calling status post lap appy on 12/30/11 asking if, one week from now, she could go swimming. I advised over two weeks out from surgery, her incisions should be healed enough to swim. She is advised no lifting, pushing, pulling over 15 lbs for two weeks post surgery. She will call with any other questions.

## 2012-01-12 ENCOUNTER — Ambulatory Visit (INDEPENDENT_AMBULATORY_CARE_PROVIDER_SITE_OTHER): Payer: BC Managed Care – PPO | Admitting: General Surgery

## 2012-01-12 ENCOUNTER — Encounter (INDEPENDENT_AMBULATORY_CARE_PROVIDER_SITE_OTHER): Payer: Self-pay | Admitting: General Surgery

## 2012-01-12 VITALS — BP 106/58 | HR 48 | Temp 98.0°F | Resp 12 | Ht 62.0 in | Wt 101.0 lb

## 2012-01-12 DIAGNOSIS — Z9889 Other specified postprocedural states: Secondary | ICD-10-CM

## 2012-01-12 DIAGNOSIS — Z48812 Encounter for surgical aftercare following surgery on the circulatory system: Secondary | ICD-10-CM

## 2012-01-12 NOTE — Progress Notes (Signed)
History: Patient returns 2 weeks following emergency laparoscopic appendectomy for acute suppurative appendicitis without complicating factors. She still has some soreness around her right upper quadrant trocar site but has been active with routine activities and denies fever or abdominal pain or GI complaints  Exam: BP 106/58  Pulse 48  Temp 98 F (36.7 C) (Temporal)  Resp 12  Ht 5\' 2"  (1.575 m)  Wt 101 lb (45.813 kg)  BMI 18.47 kg/m2  LMP 12/20/2011  General: Appears well Abdomen: Incision is well-healed. Minimal tenderness around the right upper quadrant trocar site.  Assessment and plan: Doing well following microscopic appendectomy without complication. She is to return as needed.

## 2012-01-23 ENCOUNTER — Encounter (INDEPENDENT_AMBULATORY_CARE_PROVIDER_SITE_OTHER): Payer: BC Managed Care – PPO | Admitting: Surgery

## 2012-01-25 ENCOUNTER — Encounter (INDEPENDENT_AMBULATORY_CARE_PROVIDER_SITE_OTHER): Payer: BC Managed Care – PPO | Admitting: Surgery

## 2012-05-22 ENCOUNTER — Ambulatory Visit (INDEPENDENT_AMBULATORY_CARE_PROVIDER_SITE_OTHER): Payer: BC Managed Care – PPO | Admitting: Family Medicine

## 2012-05-22 ENCOUNTER — Encounter: Payer: Self-pay | Admitting: Family Medicine

## 2012-05-22 VITALS — BP 101/70 | HR 74 | Temp 98.6°F | Ht 62.0 in | Wt 103.0 lb

## 2012-05-22 DIAGNOSIS — J329 Chronic sinusitis, unspecified: Secondary | ICD-10-CM | POA: Insufficient documentation

## 2012-05-22 DIAGNOSIS — IMO0001 Reserved for inherently not codable concepts without codable children: Secondary | ICD-10-CM

## 2012-05-22 DIAGNOSIS — K219 Gastro-esophageal reflux disease without esophagitis: Secondary | ICD-10-CM

## 2012-05-22 DIAGNOSIS — K5900 Constipation, unspecified: Secondary | ICD-10-CM

## 2012-05-22 HISTORY — DX: Constipation, unspecified: K59.00

## 2012-05-22 HISTORY — DX: Chronic sinusitis, unspecified: J32.9

## 2012-05-22 HISTORY — DX: Reserved for inherently not codable concepts without codable children: IMO0001

## 2012-05-22 MED ORDER — AZITHROMYCIN 250 MG PO TABS: ORAL_TABLET | ORAL | Status: DC

## 2012-05-22 MED ORDER — HYDROCOD POLST-CHLORPHEN POLST 10-8 MG/5ML PO LQCR: 5.0000 mL | Freq: Every evening | ORAL | Status: DC | PRN

## 2012-05-22 MED ORDER — RANITIDINE HCL 300 MG PO TABS: 300.0000 mg | ORAL_TABLET | Freq: Every day | ORAL | Status: DC

## 2012-05-22 NOTE — Progress Notes (Signed)
Patient ID: Nancy Higgins, female   DOB: 12-04-1975, 36 y.o.   MRN: [id number] Nancy Higgins [id number] 02/03/1976 05/22/2012      Progress Note-Follow Up  Subjective  Chief Complaint  Chief Complaint  Patient presents with  . Cough    with yellow-green phlegm X 4 days w/ sinus pressure    HPI  Patient is a 36 year old Caucasian female who is in today for a four-day history of worsening sinus congestion. She's here today because she is headache sinus pressure. The last 2 days she developed a bad cough which is keeping her up at night. Cough is productive of yellow-green phlegm. No obvious fevers or chills but some malaise and myalgias are present. No GI symptoms, chest pain palpitations or shortness of breath or noted. She does not last month she spent several weeks with a dry irritated cough which was worse at night. Further questioning she technologist a long history of constipation. Often only moving her bowels every 5-6 days. She had an appendectomy last year it's been worse since then. She also notes she's had more trouble with heartburn is having almost daily symptoms at this time.  Past Medical History  Diagnosis Date  . GERD (gastroesophageal reflux disease)   . Anxiety   . Allergy   . Constipation   . Anemia     with pregnancy  . History of renal stone   . Anxiety and depression   . Sinusitis 05/22/2012  . Reflux 05/22/2012  . Constipation 05/22/2012    Past Surgical History  Procedure Date  . Lithotripsy     once during pregnancy  . Cystoscopy w/ ureteral stent placement     and removal  . Laparoscopic appendectomy 12/30/2011    Procedure: APPENDECTOMY LAPAROSCOPIC;  Surgeon: Mariella Saa, MD;  Location: [facility];  Service: General;  Laterality: N/A;    Family History  Problem Relation Age of Onset  . Thyroid disease Mother   . Other Mother     uterine prolapse  . Scoliosis Mother   . Thyroid disease Father   . Thyroid disease Brother   . COPD  Maternal Grandmother   . Cancer Maternal Grandfather     colon cancer  . Scoliosis Sister   . Scoliosis Sister     History   Social History  . Marital Status: Married    Spouse Name: N/A    Number of Children: N/A  . Years of Education: N/A   Occupational History  . Not on file.   Social History Main Topics  . Smoking status: Former Games developer  . Smokeless tobacco: Never Used  . Alcohol Use: Yes  . Drug Use: No  . Sexually Active: Not on file   Other Topics Concern  . Not on file   Social History Narrative  . No narrative on file    Current Outpatient Prescriptions on File Prior to Visit  Medication Sig Dispense Refill  . ALPRAZolam (XANAX) 0.25 MG tablet 1/2 to 2 tabs daily as needed  20 tablet  1  . etonogestrel-ethinyl estradiol (NUVARING) 0.12-0.015 MG/24HR vaginal ring Place 1 each vaginally every 28 (twenty-eight) days. Insert vaginally and leave in place for 3 consecutive weeks, then remove for 1 week.      Marland Kitchen ibuprofen (ADVIL,MOTRIN) 200 MG tablet Take 400 mg by mouth every 6 (six) hours as needed. Patient took this medication for menstrual cramps.      Marland Kitchen acetaminophen (TYLENOL) 500 MG tablet Take 1,000 mg by mouth every  6 (six) hours as needed. Patient used this medication for her headache.      . ranitidine (ZANTAC) 300 MG tablet Take 1 tablet (300 mg total) by mouth at bedtime. For 30 days then as needed  30 tablet  5    No Known Allergies  Review of Systems  Review of Systems  Constitutional: Negative for fever and malaise/fatigue.  HENT: Positive for congestion and sore throat.   Eyes: Negative for discharge.  Respiratory: Positive for cough and sputum production. Negative for shortness of breath.   Cardiovascular: Negative for chest pain, palpitations and leg swelling.  Gastrointestinal: Positive for heartburn and abdominal pain. Negative for nausea and diarrhea.  Genitourinary: Negative for dysuria.  Musculoskeletal: Negative for falls.  Skin: Negative  for rash.  Neurological: Positive for headaches. Negative for loss of consciousness.  Endo/Heme/Allergies: Negative for polydipsia.  Psychiatric/Behavioral: Negative for depression and suicidal ideas. The patient is not nervous/anxious and does not have insomnia.     Objective  BP 101/70  Pulse 74  Temp 98.6 F (37 C) (Temporal)  Ht 5\' 2"  (1.575 m)  Wt 103 lb (46.72 kg)  BMI 18.84 kg/m2  SpO2 97%  LMP 05/01/2012  Physical Exam  Physical Exam  Constitutional: She is oriented to person, place, and time and well-developed, well-nourished, and in no distress. No distress.  HENT:  Head: Normocephalic and atraumatic.       Oropharynx erythematous  Eyes: Conjunctivae normal are normal.  Neck: Neck supple. No thyromegaly present.  Cardiovascular: Normal rate, regular rhythm and normal heart sounds.   No murmur heard. Pulmonary/Chest: Effort normal and breath sounds normal. She has no wheezes.  Abdominal: She exhibits no distension and no mass.  Musculoskeletal: She exhibits no edema.  Lymphadenopathy:    She has cervical adenopathy.  Neurological: She is alert and oriented to person, place, and time.  Skin: Skin is warm and dry. No rash noted. She is not diaphoretic.  Psychiatric: Memory, affect and judgment normal.    Lab Results  Component Value Date   TSH 4.796* 09/05/2011   Lab Results  Component Value Date   WBC 15.2* 12/29/2011   HGB 11.9* 12/29/2011   HCT 35.1* 12/29/2011   MCV 88.4 12/29/2011   PLT 215 12/29/2011   Lab Results  Component Value Date   CREATININE 0.70 12/29/2011   BUN 8 12/29/2011   NA 137 12/29/2011   K 3.7 12/29/2011   CL 102 12/29/2011   CO2 23 12/29/2011   Lab Results  Component Value Date   ALT 16 12/29/2011   AST 21 12/29/2011   ALKPHOS 48 12/29/2011   BILITOT 0.3 12/29/2011   Lab Results  Component Value Date   CHOL 187 09/05/2011   Lab Results  Component Value Date   HDL 74 09/05/2011   Lab Results  Component Value Date   LDLCALC 90  09/05/2011   Lab Results  Component Value Date   TRIG 116 09/05/2011   Lab Results  Component Value Date   CHOLHDL 2.5 09/05/2011     Assessment & Plan  Constipation Long history of bowels every 5-6 days. Encouraged 64 ounces of clear fluids. Increased exercise. Increase fiber in the diet. Add Metamucil once to twice daily and a probiotic daily. If no results after 3 days will try milk of magnesia 2 tablespoons and 4 ounces warm prune juice by mouth and a Dulcolax suppository at the same time. Notify us if no improvement  Reflux Avoid offending foods, start Ranitidine  300 mg qhs for a month and then drop to prn  Sinusitis Start on Zpak and Tussionex prn, incrase rest and start a probiotic

## 2012-05-22 NOTE — Patient Instructions (Addendum)
Add Metamucil daily to twice daily Increase clear fluids to at least 64 oz daily Exercise Probiotics such as Digestive Advantage by Schiff daily If no BM in 3 days try Milk of Magnesia 2 tbls in 4 oz of warm prune juice and repeat in 4-8 hours as needed if no results  Add mucinex 600mg  twice daily x 10 days   Constipation, Adult Constipation is when a person has fewer than 3 bowel movements a week; has difficulty having a bowel movement; or has stools that are dry, hard, or larger than normal. As people grow older, constipation is more common. If you try to fix constipation with medicines that make you have a bowel movement (laxatives), the problem may get worse. Long-term laxative use may cause the muscles of the colon to become weak. A low-fiber diet, not taking in enough fluids, and taking certain medicines may make constipation worse. CAUSES   Certain medicines, such as antidepressants, pain medicine, iron supplements, antacids, and water pills.   Certain diseases, such as diabetes, irritable bowel syndrome (IBS), thyroid disease, or depression.   Not drinking enough water.   Not eating enough fiber-rich foods.   Stress or travel.  Lack of physical activity or exercise.  Not going to the restroom when there is the urge to have a bowel movement.  Ignoring the urge to have a bowel movement.  Using laxatives too much. SYMPTOMS   Having fewer than 3 bowel movements a week.   Straining to have a bowel movement.   Having hard, dry, or larger than normal stools.   Feeling full or bloated.   Pain in the lower abdomen.  Not feeling relief after having a bowel movement. DIAGNOSIS  Your caregiver will take a medical history and perform a physical exam. Further testing may be done for severe constipation. Some tests may include:   A barium enema X-ray to examine your rectum, colon, and sometimes, your small intestine.  A sigmoidoscopy to examine your lower colon.  A  colonoscopy to examine your entire colon. TREATMENT  Treatment will depend on the severity of your constipation and what is causing it. Some dietary treatments include drinking more fluids and eating more fiber-rich foods. Lifestyle treatments may include regular exercise. If these diet and lifestyle recommendations do not help, your caregiver may recommend taking over-the-counter laxative medicines to help you have bowel movements. Prescription medicines may be prescribed if over-the-counter medicines do not work.  HOME CARE INSTRUCTIONS   Increase dietary fiber in your diet, such as fruits, vegetables, whole grains, and beans. Limit high-fat and processed sugars in your diet, such as [place] fries, hamburgers, cookies, candies, and soda.   A fiber supplement may be added to your diet if you cannot get enough fiber from foods.   Drink enough fluids to keep your urine clear or pale yellow.   Exercise regularly or as directed by your caregiver.   Go to the restroom when you have the urge to go. Do not hold it.  Only take medicines as directed by your caregiver. Do not take other medicines for constipation without talking to your caregiver first. SEEK IMMEDIATE MEDICAL CARE IF:   You have bright red blood in your stool.   Your constipation lasts for more than 4 days or gets worse.   You have abdominal or rectal pain.   You have thin, pencil-like stools.  You have unexplained weight loss. MAKE SURE YOU:   Understand these instructions.  Will watch your condition.  Will  get help right away if you are not doing well or get worse. Document Released: 03/03/2004 Document Revised: 08/28/2011 Document Reviewed: 05/09/2011 [facility] Patient Information 2013 [place], [place].

## 2012-05-22 NOTE — Assessment & Plan Note (Signed)
Start on Zpak and Tussionex prn, incrase rest and start a probiotic

## 2012-05-22 NOTE — Assessment & Plan Note (Signed)
Avoid offending foods, start Ranitidine 300 mg qhs for a month and then drop to prn

## 2012-05-22 NOTE — Assessment & Plan Note (Signed)
Long history of bowels every 5-6 days. Encouraged 64 ounces of clear fluids. Increased exercise. Increase fiber in the diet. Add Metamucil once to twice daily and a probiotic daily. If no results after 3 days will try milk of magnesia 2 tablespoons and 4 ounces warm prune juice by mouth and a Dulcolax suppository at the same time. Notify us if no improvement

## 2012-05-24 ENCOUNTER — Encounter: Payer: Self-pay | Admitting: Family Medicine

## 2012-05-24 ENCOUNTER — Ambulatory Visit (INDEPENDENT_AMBULATORY_CARE_PROVIDER_SITE_OTHER): Payer: BC Managed Care – PPO | Admitting: Family Medicine

## 2012-05-24 ENCOUNTER — Telehealth: Payer: Self-pay | Admitting: Family Medicine

## 2012-05-24 VITALS — BP 96/65 | HR 49 | Temp 98.9°F | Ht 62.0 in | Wt 103.8 lb

## 2012-05-24 DIAGNOSIS — H6692 Otitis media, unspecified, left ear: Secondary | ICD-10-CM | POA: Insufficient documentation

## 2012-05-24 DIAGNOSIS — H6690 Otitis media, unspecified, unspecified ear: Secondary | ICD-10-CM

## 2012-05-24 DIAGNOSIS — J329 Chronic sinusitis, unspecified: Secondary | ICD-10-CM

## 2012-05-24 HISTORY — DX: Otitis media, unspecified, left ear: H66.92

## 2012-05-24 MED ORDER — ANTIPYRINE-BENZOCAINE 5.4-1.4 % OT SOLN: 3.0000 [drp] | OTIC | Status: DC | PRN

## 2012-05-24 MED ORDER — CEFTRIAXONE SODIUM 500 MG IJ SOLR
500.0000 mg | Freq: Once | INTRAMUSCULAR | Status: AC
  Administered 2012-05-24: 500 mg via INTRAMUSCULAR

## 2012-05-24 MED ORDER — CEFDINIR 300 MG PO CAPS: 300.0000 mg | ORAL_CAPSULE | Freq: Two times a day (BID) | ORAL | Status: DC

## 2012-05-24 MED ORDER — CEFTRIAXONE SODIUM 1 G IJ SOLR: 500.0000 mg | Freq: Once | INTRAMUSCULAR | Status: DC

## 2012-05-24 MED ORDER — HYDROCODONE-ACETAMINOPHEN 10-325 MG PO TABS: 1.0000 | ORAL_TABLET | Freq: Three times a day (TID) | ORAL | Status: DC | PRN

## 2012-05-24 NOTE — Assessment & Plan Note (Signed)
Has a history of very painful otitis media in past with rupture. She is very anxious about having that much pain again, she is reassured the TM is not bulging. She is given a shot of Rocephin 500mg  IM, she will finish course of Azithromycin and is given an rx for Omnicef to hold onto and fill if she is still feeling poorly when she finishes the Zpak. Given Norco 10/325 to use 1/2 to 1 tab prn pain tid and some Auralgan drops. Call or seek care if symptoms worsen

## 2012-05-24 NOTE — Telephone Encounter (Signed)
Patient Information:  Caller Name: Kamiryn  Phone: [phone]  Patient: Nancy Higgins  Gender: Female  DOB: 06/17/1973  Age: 36 Years  PCP: Danise Edge [facility])  Pregnant: No   Symptoms  Reason For Call & Symptoms: Patient in the office on Wednesday 05/22/12 for sinus cold symptoms.  Seen by Dr. Abner Greenspan and given Rx for Zpack and Tussionex.  Onset of Left ear pain yesterday 05/23/12. Pressure and painful with movement and touch. She reports Throbbing along left side of face.  She is tearful and crying. reviiewed Health History In EMR: Yes  Reviewed Medications In EMR: Yes  Reviewed Allergies In EMR: Yes  Reviewed Surgeries / Procedures: No  Date of Onset of Symptoms: 05/23/2012  Treatments Tried: Ibuprofen , Tussionex , Zpack  Treatments Tried Worked: No OB:  LMP: 05/17/2012  Guideline(s) Used:  Earache  Ear - Swimmer's - Otitis Externa  Disposition Per Guideline:   See Today in Office  Reason For Disposition Reached:   Severe earache pain  Advice Given:  Pain Medicines:  For pain relief, you can take either acetaminophen, ibuprofen, or naproxen.  They are over-the-counter (OTC) pain drugs. You can buy them at the drugstore.  Local Heat:   If pain is moderate to severe, apply a heating pad (set on low) or hot water bottle (wrapped in a towel) to outer ear for 20 minutes (Caution: avoid burns). This will also increase drainage.  Call Back If:  Ear symptoms last longer than 7 days with treatment  You become worse.  Prevention - Keep Ear Canals Dry  Try to keep your ear canals dry.  After showers, hair washing, and swimming, help the water run out by tilting the head to one side. You can also use a hair dryer set on the lowest setting to dry out your ears.  Office Follow Up:  Does the office need to follow up with this patient?: No  Instructions For The Office: N/A  Appointment Scheduled:  05/24/2012 09:30:38 Appointment Scheduled Provider:  Danise Edge  [facility])

## 2012-05-24 NOTE — Progress Notes (Signed)
Patient ID: Nancy Higgins, female   DOB: 1975-11-24, 36 y.o.   MRN: [id number] MADINE SARR [id number] Feb 13, 1976 05/24/2012      Progress Note-Follow Up  Subjective  Chief Complaint  Chief Complaint  Patient presents with  . Otitis Media    left ear pain X 2 days    HPI  Patient is a 36 year old Caucasian female who is in today for evaluation of left ear pain. She was just in a couple days ago we initiated treatment for sinusitis and unfortunately over the last 2 days she developed increasing ear pain. She is very apprehensive secondary to a bad history of recurrent ear infections resulting in an ruptured eardrum. Should the pain was severe and she is worried about getting that level of pain again. She is also headed out of town on vacation. She's had low-grade fevers and chills continues to have malaise and headache. Nasal congestion is also noted. No chest pain, chest congestion, palpitations, shortness of breath, GI or GU complaints otherwise noted she is tolerating the azithromycin we started her on earlier in the week for sinusitis.  Past Medical History  Diagnosis Date  . GERD (gastroesophageal reflux disease)   . Anxiety   . Allergy   . Constipation   . Anemia     with pregnancy  . History of renal stone   . Anxiety and depression   . Sinusitis 05/22/2012  . Reflux 05/22/2012  . Constipation 05/22/2012  . LOM (left otitis media) 05/24/2012    Past Surgical History  Procedure Date  . Lithotripsy     once during pregnancy  . Cystoscopy w/ ureteral stent placement     and removal  . Laparoscopic appendectomy 12/30/2011    Procedure: APPENDECTOMY LAPAROSCOPIC;  Surgeon: Mariella Saa, MD;  Location: [facility];  Service: General;  Laterality: N/A;    Family History  Problem Relation Age of Onset  . Thyroid disease Mother   . Other Mother     uterine prolapse  . Scoliosis Mother   . Thyroid disease Father   . Thyroid disease Brother   . COPD Maternal  Grandmother   . Cancer Maternal Grandfather     colon cancer  . Scoliosis Sister   . Scoliosis Sister     History   Social History  . Marital Status: Married    Spouse Name: N/A    Number of Children: N/A  . Years of Education: N/A   Occupational History  . Not on file.   Social History Main Topics  . Smoking status: Former Games developer  . Smokeless tobacco: Never Used  . Alcohol Use: Yes  . Drug Use: No  . Sexually Active: Not on file   Other Topics Concern  . Not on file   Social History Narrative  . No narrative on file    Current Outpatient Prescriptions on File Prior to Visit  Medication Sig Dispense Refill  . acetaminophen (TYLENOL) 500 MG tablet Take 1,000 mg by mouth every 6 (six) hours as needed. Patient used this medication for her headache.      . ALPRAZolam (XANAX) 0.25 MG tablet 1/2 to 2 tabs daily as needed  20 tablet  1  . azithromycin (ZITHROMAX) 250 MG tablet 2 tabs po once and then 1 tab po daily x 4 days  6 tablet  0  . chlorpheniramine-HYDROcodone (TUSSIONEX PENNKINETIC ER) 10-8 MG/5ML LQCR Take 5 mLs by mouth at bedtime as needed.  140 mL  1  .  etonogestrel-ethinyl estradiol (NUVARING) 0.12-0.015 MG/24HR vaginal ring Place 1 each vaginally every 28 (twenty-eight) days. Insert vaginally and leave in place for 3 consecutive weeks, then remove for 1 week.      Marland Kitchen ibuprofen (ADVIL,MOTRIN) 200 MG tablet Take 400 mg by mouth every 6 (six) hours as needed. Patient took this medication for menstrual cramps.      . ranitidine (ZANTAC) 300 MG tablet Take 1 tablet (300 mg total) by mouth at bedtime. For 30 days then as needed  30 tablet  5   No current facility-administered medications on file prior to visit.    No Known Allergies  Review of Systems  Review of Systems  Constitutional: Positive for fever and malaise/fatigue.  HENT: Positive for ear pain and congestion. Negative for ear discharge.   Eyes: Negative for discharge.  Respiratory: Positive for cough.  Negative for shortness of breath.   Cardiovascular: Negative for chest pain, palpitations and leg swelling.  Gastrointestinal: Negative for nausea, abdominal pain and diarrhea.  Genitourinary: Negative for dysuria.  Musculoskeletal: Negative for falls.  Skin: Negative for rash.  Neurological: Negative for loss of consciousness and headaches.  Endo/Heme/Allergies: Negative for polydipsia.  Psychiatric/Behavioral: Negative for depression and suicidal ideas. The patient is not nervous/anxious and does not have insomnia.     Objective  BP 96/65  Pulse 49  Temp 98.9 F (37.2 C) (Temporal)  Ht 5\' 2"  (1.575 m)  Wt 103 lb 12.8 oz (47.083 kg)  BMI 18.99 kg/m2  SpO2 99%  LMP 05/01/2012  Physical Exam  Physical Exam  Constitutional: She is oriented to person, place, and time and well-developed, well-nourished, and in no distress. No distress.  HENT:  Head: Normocephalic and atraumatic.       Left TM dull, erythematous and retracted. Left tm not erythematous but mildly retracted and dull  Eyes: Conjunctivae normal are normal.  Neck: Neck supple. No thyromegaly present.  Cardiovascular: Normal rate, regular rhythm and normal heart sounds.   No murmur heard. Pulmonary/Chest: Effort normal and breath sounds normal. She has no wheezes.  Abdominal: She exhibits no distension and no mass.  Musculoskeletal: She exhibits no edema.  Lymphadenopathy:    She has no cervical adenopathy.  Neurological: She is alert and oriented to person, place, and time.  Skin: Skin is warm and dry. No rash noted. She is not diaphoretic.  Psychiatric: Memory, affect and judgment normal.    Lab Results  Component Value Date   TSH 4.796* 09/05/2011   Lab Results  Component Value Date   WBC 15.2* 12/29/2011   HGB 11.9* 12/29/2011   HCT 35.1* 12/29/2011   MCV 88.4 12/29/2011   PLT 215 12/29/2011   Lab Results  Component Value Date   CREATININE 0.70 12/29/2011   BUN 8 12/29/2011   NA 137 12/29/2011   K 3.7  12/29/2011   CL 102 12/29/2011   CO2 23 12/29/2011   Lab Results  Component Value Date   ALT 16 12/29/2011   AST 21 12/29/2011   ALKPHOS 48 12/29/2011   BILITOT 0.3 12/29/2011   Lab Results  Component Value Date   CHOL 187 09/05/2011   Lab Results  Component Value Date   HDL 74 09/05/2011   Lab Results  Component Value Date   LDLCALC 90 09/05/2011   Lab Results  Component Value Date   TRIG 116 09/05/2011   Lab Results  Component Value Date   CHOLHDL 2.5 09/05/2011     Assessment & Plan  LOM (left  otitis media) Has a history of very painful otitis media in past with rupture. She is very anxious about having that much pain again, she is reassured the TM is not bulging. She is given a shot of Rocephin 500mg  IM, she will finish course of Azithromycin and is given an rx for Omnicef to hold onto and fill if she is still feeling poorly when she finishes the Zpak. Given Norco 10/325 to use 1/2 to 1 tab prn pain tid and some Auralgan drops. Call or seek care if symptoms worsen  Sinusitis Finish Zpak and given Rocephin, Omnicef only if not improving. Probiotics and Mucinex also recommended

## 2012-05-24 NOTE — Patient Instructions (Addendum)

## 2012-05-24 NOTE — Assessment & Plan Note (Signed)
Finish Zpak and given Rocephin, Omnicef only if not improving. Probiotics and Mucinex also recommended

## 2012-07-02 ENCOUNTER — Other Ambulatory Visit: Payer: Self-pay

## 2012-07-02 DIAGNOSIS — F329 Major depressive disorder, single episode, unspecified: Secondary | ICD-10-CM

## 2012-07-02 MED ORDER — ALPRAZOLAM 0.25 MG PO TABS: ORAL_TABLET | ORAL | Status: DC

## 2012-07-02 NOTE — Telephone Encounter (Signed)
Faxed to pharmacy

## 2012-07-02 NOTE — Telephone Encounter (Signed)
Please advise xanax refill? Last RX wrote on 09-05-11 quantity 20 with 1 refill.  If ok fax to [phone]

## 2013-01-06 ENCOUNTER — Telehealth: Payer: Self-pay | Admitting: Family Medicine

## 2013-01-06 DIAGNOSIS — F419 Anxiety disorder, unspecified: Secondary | ICD-10-CM

## 2013-01-06 MED ORDER — ALPRAZOLAM 0.25 MG PO TABS: ORAL_TABLET | ORAL | Status: DC

## 2013-01-06 NOTE — Telephone Encounter (Signed)
Please advise refill? Last RX was done on 07-02-12 quantity 20 with 1 refill  If ok to refill fax to [phone]

## 2013-01-06 NOTE — Telephone Encounter (Signed)
Refill-alprazolam 0.25mg  tablets. Take 1/2 to 2 tablets by mouth every day as needed. Qty 20 last fill 1.14.14

## 2013-01-06 NOTE — Telephone Encounter (Signed)
OK to refill Alprazolam with same strength, same sig, #20 with 1 rf

## 2013-01-06 NOTE — Telephone Encounter (Signed)
RX faxed

## 2013-04-22 ENCOUNTER — Encounter: Payer: Self-pay | Admitting: Nurse Practitioner

## 2013-04-22 ENCOUNTER — Ambulatory Visit (INDEPENDENT_AMBULATORY_CARE_PROVIDER_SITE_OTHER): Payer: BC Managed Care – PPO | Admitting: Nurse Practitioner

## 2013-04-22 VITALS — BP 90/60 | HR 54 | Temp 97.9°F | Ht 62.0 in | Wt 99.5 lb

## 2013-04-22 DIAGNOSIS — R05 Cough: Secondary | ICD-10-CM

## 2013-04-22 DIAGNOSIS — H6593 Unspecified nonsuppurative otitis media, bilateral: Secondary | ICD-10-CM

## 2013-04-22 DIAGNOSIS — J019 Acute sinusitis, unspecified: Secondary | ICD-10-CM

## 2013-04-22 DIAGNOSIS — H6590 Unspecified nonsuppurative otitis media, unspecified ear: Secondary | ICD-10-CM

## 2013-04-22 MED ORDER — BENZONATATE 100 MG PO CAPS: ORAL_CAPSULE | ORAL | Status: DC

## 2013-04-22 MED ORDER — AMOXICILLIN-POT CLAVULANATE 875-125 MG PO TABS: 1.0000 | ORAL_TABLET | Freq: Two times a day (BID) | ORAL | Status: DC

## 2013-04-22 NOTE — Progress Notes (Signed)
  Subjective:    Patient ID: Nancy Higgins, female    DOB: 08-09-1975, 37 y.o.   MRN: [id number]  Sinusitis This is a new problem. The current episode started 1 to 4 weeks ago (2 wks). The problem has been gradually worsening since onset. There has been no fever. Associated symptoms include congestion, coughing, ear pain, headaches, a hoarse voice, sinus pressure and a sore throat. Pertinent negatives include no chills or shortness of breath. Past treatments include oral decongestants. The treatment provided mild relief.      Review of Systems  Constitutional: Negative for fever, chills, activity change, appetite change and fatigue.  HENT: Positive for congestion, ear pain, hoarse voice, sinus pressure and sore throat.   Eyes: Negative for redness.  Respiratory: Positive for cough and chest tightness. Negative for shortness of breath and wheezing.   Gastrointestinal: Negative for diarrhea.  Neurological: Positive for headaches.  Hematological: Negative for adenopathy.       Objective:   Physical Exam  Vitals reviewed. Constitutional: She is oriented to person, place, and time. She appears well-developed and well-nourished. No distress.  HENT:  Head: Normocephalic and atraumatic.  Right Ear: Tympanic membrane is not injected. A middle ear effusion is present.  Left Ear: Tympanic membrane is not injected. A middle ear effusion is present.  Mouth/Throat: Oropharynx is clear and moist. No oropharyngeal exudate.  Eyes: Conjunctivae are normal. Pupils are equal, round, and reactive to light. Right eye exhibits no discharge. Left eye exhibits no discharge.  Neck: Normal range of motion. Neck supple. No thyromegaly present.  Cardiovascular: Normal rate, regular rhythm and normal heart sounds.   No murmur heard. Pulmonary/Chest: Effort normal and breath sounds normal. No respiratory distress. She has no wheezes.  Lymphadenopathy:    She has cervical adenopathy (ant cervical bilat LAD.  NT).  Neurological: She is alert and oriented to person, place, and time.  Skin: Skin is warm and dry.  Psychiatric: She has a normal mood and affect. Her behavior is normal. Thought content normal.          Assessment & Plan:  1. Acute sinus infection 2 weeks cough & purulent sinus drainage - amoxicillin-clavulanate (AUGMENTIN) 875-125 MG per tablet; Take 1 tablet by mouth 2 (two) times daily.  Dispense: 10 tablet; Refill: 0  2. Cough Productive, worse at night & am - benzonatate (TESSALON) 100 MG capsule; Take 1-2 capsules po up to 3 times daily PRN cough  Dispense: 60 capsule; Refill: 0  3. Middle ear effusion, bilat Pseudoephedrine.

## 2013-04-22 NOTE — Patient Instructions (Addendum)
I will treat you for bacterial sinusitis. Start antibiotic. Eat yogurt daily at lunch time. Start daily sinus rinses-neil med sinus rinse. Continue to use pseudoephedrine to help clear fluid in ears. You may use benzonatate in the evening to reduce cough. Feel better.  Sinusitis Sinusitis is redness, soreness, and swelling (inflammation) of the paranasal sinuses. Paranasal sinuses are air pockets within the bones of your face (beneath the eyes, the middle of the forehead, or above the eyes). In healthy paranasal sinuses, mucus is able to drain out, and air is able to circulate through them by way of your nose. However, when your paranasal sinuses are inflamed, mucus and air can become trapped. This can allow bacteria and other germs to grow and cause infection. Sinusitis can develop quickly and last only a short time (acute) or continue over a long period (chronic). Sinusitis that lasts for more than 12 weeks is considered chronic.  CAUSES  Causes of sinusitis include:  Allergies.  Structural abnormalities, such as displacement of the cartilage that separates your nostrils (deviated septum), which can decrease the air flow through your nose and sinuses and affect sinus drainage.  Functional abnormalities, such as when the small hairs (cilia) that line your sinuses and help remove mucus do not work properly or are not present. SYMPTOMS  Symptoms of acute and chronic sinusitis are the same. The primary symptoms are pain and pressure around the affected sinuses. Other symptoms include:  Upper toothache.  Earache.  Headache.  Bad breath.  Decreased sense of smell and taste.  A cough, which worsens when you are lying flat.  Fatigue.  Fever.  Thick drainage from your nose, which often is green and may contain pus (purulent).  Swelling and warmth over the affected sinuses. DIAGNOSIS  Your caregiver will perform a physical exam. During the exam, your caregiver may:  Look in your nose  for signs of abnormal growths in your nostrils (nasal polyps).  Tap over the affected sinus to check for signs of infection.  View the inside of your sinuses (endoscopy) with a special imaging device with a light attached (endoscope), which is inserted into your sinuses. If your caregiver suspects that you have chronic sinusitis, one or more of the following tests may be recommended:  Allergy tests.  Nasal culture A sample of mucus is taken from your nose and sent to a lab and screened for bacteria.  Nasal cytology A sample of mucus is taken from your nose and examined by your caregiver to determine if your sinusitis is related to an allergy. TREATMENT  Most cases of acute sinusitis are related to a viral infection and will resolve on their own within 10 days. Sometimes medicines are prescribed to help relieve symptoms (pain medicine, decongestants, nasal steroid sprays, or saline sprays).  However, for sinusitis related to a bacterial infection, your caregiver will prescribe antibiotic medicines. These are medicines that will help kill the bacteria causing the infection.  Rarely, sinusitis is caused by a fungal infection. In theses cases, your caregiver will prescribe antifungal medicine. For some cases of chronic sinusitis, surgery is needed. Generally, these are cases in which sinusitis recurs more than 3 times per year, despite other treatments. HOME CARE INSTRUCTIONS   Drink plenty of water. Water helps thin the mucus so your sinuses can drain more easily.  Use a humidifier.  Inhale steam 3 to 4 times a day (for example, sit in the bathroom with the shower running).  Apply a warm, moist washcloth to your  face 3 to 4 times a day, or as directed by your caregiver.  Use saline nasal sprays to help moisten and clean your sinuses.  Take over-the-counter or prescription medicines for pain, discomfort, or fever only as directed by your caregiver. SEEK IMMEDIATE MEDICAL CARE IF:  You  have increasing pain or severe headaches.  You have nausea, vomiting, or drowsiness.  You have swelling around your face.  You have vision problems.  You have a stiff neck.  You have difficulty breathing. MAKE SURE YOU:   Understand these instructions.  Will watch your condition.  Will get help right away if you are not doing well or get worse. Document Released: 06/05/2005 Document Revised: 08/28/2011 Document Reviewed: 06/20/2011 [facility] Patient Information 2014 [place], [place].

## 2013-06-26 ENCOUNTER — Ambulatory Visit (INDEPENDENT_AMBULATORY_CARE_PROVIDER_SITE_OTHER): Payer: BC Managed Care – PPO | Admitting: Family Medicine

## 2013-06-26 ENCOUNTER — Encounter: Payer: Self-pay | Admitting: Family Medicine

## 2013-06-26 ENCOUNTER — Other Ambulatory Visit: Payer: Self-pay | Admitting: Family Medicine

## 2013-06-26 VITALS — BP 90/64 | HR 64 | Temp 97.9°F | Ht 62.0 in | Wt 97.1 lb

## 2013-06-26 DIAGNOSIS — K219 Gastro-esophageal reflux disease without esophagitis: Secondary | ICD-10-CM

## 2013-06-26 DIAGNOSIS — R1013 Epigastric pain: Secondary | ICD-10-CM

## 2013-06-26 DIAGNOSIS — F341 Dysthymic disorder: Secondary | ICD-10-CM

## 2013-06-26 DIAGNOSIS — F419 Anxiety disorder, unspecified: Secondary | ICD-10-CM

## 2013-06-26 DIAGNOSIS — IMO0001 Reserved for inherently not codable concepts without codable children: Secondary | ICD-10-CM

## 2013-06-26 DIAGNOSIS — F329 Major depressive disorder, single episode, unspecified: Secondary | ICD-10-CM

## 2013-06-26 DIAGNOSIS — Z23 Encounter for immunization: Secondary | ICD-10-CM

## 2013-06-26 LAB — RENAL FUNCTION PANEL
Albumin: 4.3 g/dL (ref 3.5–5.2)
BUN: 9 mg/dL (ref 6–23)
CHLORIDE: 103 meq/L (ref 96–112)
CO2: 28 mEq/L (ref 19–32)
Calcium: 9 mg/dL (ref 8.4–10.5)
Creat: 0.58 mg/dL (ref 0.50–1.10)
Glucose, Bld: 72 mg/dL (ref 70–99)
PHOSPHORUS: 4 mg/dL (ref 2.3–4.6)
Potassium: 3.8 mEq/L (ref 3.5–5.3)
Sodium: 138 mEq/L (ref 135–145)

## 2013-06-26 LAB — HEPATIC FUNCTION PANEL
ALBUMIN: 4.3 g/dL (ref 3.5–5.2)
ALT: 31 U/L (ref 0–35)
AST: 35 U/L (ref 0–37)
Alkaline Phosphatase: 49 U/L (ref 39–117)
BILIRUBIN TOTAL: 0.5 mg/dL (ref 0.3–1.2)
Bilirubin, Direct: 0.1 mg/dL (ref 0.0–0.3)
Indirect Bilirubin: 0.4 mg/dL (ref 0.0–0.9)
Total Protein: 6.4 g/dL (ref 6.0–8.3)

## 2013-06-26 LAB — CBC
HCT: 37.4 % (ref 36.0–46.0)
Hemoglobin: 12.7 g/dL (ref 12.0–15.0)
MCH: 30.2 pg (ref 26.0–34.0)
MCHC: 34 g/dL (ref 30.0–36.0)
MCV: 88.8 fL (ref 78.0–100.0)
PLATELETS: 245 10*3/uL (ref 150–400)
RBC: 4.21 MIL/uL (ref 3.87–5.11)
RDW: 13.1 % (ref 11.5–15.5)
WBC: 3.7 10*3/uL — ABNORMAL LOW (ref 4.0–10.5)

## 2013-06-26 MED ORDER — ESCITALOPRAM OXALATE 10 MG PO TABS: 10.0000 mg | ORAL_TABLET | Freq: Every day | ORAL | Status: DC

## 2013-06-26 MED ORDER — HYOSCYAMINE SULFATE 0.125 MG SL SUBL: 0.1250 mg | SUBLINGUAL_TABLET | SUBLINGUAL | Status: DC | PRN

## 2013-06-26 MED ORDER — ALPRAZOLAM 0.25 MG PO TABS: ORAL_TABLET | ORAL | Status: DC

## 2013-06-26 NOTE — Progress Notes (Signed)
Pre visit review using our clinic review tool, if applicable. No additional management support is needed unless otherwise documented below in the visit note. 

## 2013-06-26 NOTE — Patient Instructions (Signed)
Digestive Advantage, Florastor, Culturelle, Align 2 Tums at bed Zantac 150 mg if symptoms persist daily Bland diet with complex carbs and protein   Gastroesophageal Reflux Disease, Adult Gastroesophageal reflux disease (GERD) happens when acid from your stomach flows up into the esophagus. When acid comes in contact with the esophagus, the acid causes soreness (inflammation) in the esophagus. Over time, GERD may create small holes (ulcers) in the lining of the esophagus. CAUSES   Increased body weight. This puts pressure on the stomach, making acid rise from the stomach into the esophagus.  Smoking. This increases acid production in the stomach.  Drinking alcohol. This causes decreased pressure in the lower esophageal sphincter (valve or ring of muscle between the esophagus and stomach), allowing acid from the stomach into the esophagus.  Late evening meals and a full stomach. This increases pressure and acid production in the stomach.  A malformed lower esophageal sphincter. Sometimes, no cause is found. SYMPTOMS   Burning pain in the lower part of the mid-chest behind the breastbone and in the mid-stomach area. This may occur twice a week or more often.  Trouble swallowing.  Sore throat.  Dry cough.  Asthma-like symptoms including chest tightness, shortness of breath, or wheezing. DIAGNOSIS  Your caregiver may be able to diagnose GERD based on your symptoms. In some cases, X-rays and other tests may be done to check for complications or to check the condition of your stomach and esophagus. TREATMENT  Your caregiver may recommend over-the-counter or prescription medicines to help decrease acid production. Ask your caregiver before starting or adding any new medicines.  HOME CARE INSTRUCTIONS   Change the factors that you can control. Ask your caregiver for guidance concerning weight loss, quitting smoking, and alcohol consumption.  Avoid foods and drinks that make your  symptoms worse, such as:  Caffeine or alcoholic drinks.  Chocolate.  Peppermint or mint flavorings.  Garlic and onions.  Spicy foods.  Citrus fruits, such as oranges, lemons, or limes.  Tomato-based foods such as sauce, chili, salsa, and pizza.  Fried and fatty foods.  Avoid lying down for the 3 hours prior to your bedtime or prior to taking a nap.  Eat small, frequent meals instead of large meals.  Wear loose-fitting clothing. Do not wear anything tight around your waist that causes pressure on your stomach.  Raise the head of your bed 6 to 8 inches with wood blocks to help you sleep. Extra pillows will not help.  Only take over-the-counter or prescription medicines for pain, discomfort, or fever as directed by your caregiver.  Do not take aspirin, ibuprofen, or other nonsteroidal anti-inflammatory drugs (NSAIDs). SEEK IMMEDIATE MEDICAL CARE IF:   You have pain in your arms, neck, jaw, teeth, or back.  Your pain increases or changes in intensity or duration.  You develop nausea, vomiting, or sweating (diaphoresis).  You develop shortness of breath, or you faint.  Your vomit is green, yellow, black, or looks like coffee grounds or blood.  Your stool is red, bloody, or black. These symptoms could be signs of other problems, such as heart disease, gastric bleeding, or esophageal bleeding. MAKE SURE YOU:   Understand these instructions.  Will watch your condition.  Will get help right away if you are not doing well or get worse. Document Released: 03/15/2005 Document Revised: 08/28/2011 Document Reviewed: 12/23/2010 [facility] Patient Information 2014 [employer], [place].

## 2013-06-27 LAB — H. PYLORI ANTIBODY, IGG: H Pylori IgG: <0.4 {ISR}

## 2013-06-27 LAB — TSH: TSH: 3.901 u[IU]/mL (ref 0.350–4.500)

## 2013-06-28 LAB — T4, FREE: Free T4: 0.97 ng/dL (ref 0.80–1.80)

## 2013-06-29 ENCOUNTER — Encounter: Payer: Self-pay | Admitting: Family Medicine

## 2013-06-29 NOTE — Progress Notes (Signed)
Patient ID: Nancy Higgins, female   DOB: April 22, 1976, 38 y.o.   MRN: [id number] Nancy Higgins [id number] [birth year] 06/29/2013      Progress Note-Follow Up  Subjective  Chief Complaint  Chief Complaint  Patient presents with  . stomach issues    indigestestion? feels like food is sitting on stomach (not digesting), no diarrhea- did vomitt a couple of times this weekend  . Injections    tdap    HPI  Patient is a 38 year old Caucasian female who is in today to discuss recent stressors. She and her husband are separated and she notes worsening anorexia and anxiety. She has difficulty concentrating and cries frequently. Has no appetite and is complaining of intermittent abdominal pain and cramping. Has been struggling with nausea and anorexia and has even had some episodes of vomiting over the weekend suggestive of gastroenteritis. She said the symptoms over the weekend hit very suddenly after eating. She had nausea vomiting fevers weakness and malaise. The weakness and fevers as well as vomiting resolved but the cramping and nausea as well as anorexia persisted. No chest pain or palpitations. No shortness of breath. She is in the process of starting counseling  Past Medical History  Diagnosis Date  . GERD (gastroesophageal reflux disease)   . Anxiety   . Allergy   . Constipation   . Anemia     with pregnancy  . History of renal stone   . Anxiety and depression   . Sinusitis 05/22/2012  . Reflux 05/22/2012  . Constipation 05/22/2012  . LOM (left otitis media) 05/24/2012    Past Surgical History  Procedure Laterality Date  . Lithotripsy      once during pregnancy  . Cystoscopy w/ ureteral stent placement      and removal  . Laparoscopic appendectomy  12/30/2011    Procedure: APPENDECTOMY LAPAROSCOPIC;  Surgeon: Edward Jolly, MD;  Location: [facility];  Service: General;  Laterality: N/A;    Family History  Problem Relation Age of Onset  . Thyroid disease Mother   .  Other Mother     uterine prolapse  . Scoliosis Mother   . Thyroid disease Father   . Thyroid disease Brother   . COPD Maternal Grandmother   . Cancer Maternal Grandfather     colon cancer  . Scoliosis Sister   . Scoliosis Sister     History   Social History  . Marital Status: Married    Spouse Name: N/A    Number of Children: N/A  . Years of Education: N/A   Occupational History  . Not on file.   Social History Main Topics  . Smoking status: Former Research scientist (life sciences)  . Smokeless tobacco: Never Used  . Alcohol Use: Yes  . Drug Use: No  . Sexual Activity: Not on file   Other Topics Concern  . Not on file   Social History Narrative  . No narrative on file    Current Outpatient Prescriptions on File Prior to Visit  Medication Sig Dispense Refill  . ibuprofen (ADVIL,MOTRIN) 200 MG tablet Take 400 mg by mouth every 6 (six) hours as needed. Patient took this medication for menstrual cramps.       No current facility-administered medications on file prior to visit.    No Known Allergies  Review of Systems  Review of Systems  Constitutional: Positive for weight loss. Negative for fever and malaise/fatigue.  HENT: Negative for congestion.   Eyes: Negative for discharge.  Respiratory: Negative for shortness  of breath.   Cardiovascular: Negative for chest pain, palpitations and leg swelling.  Gastrointestinal: Positive for nausea, vomiting, abdominal pain and diarrhea. Negative for blood in stool and melena.  Genitourinary: Negative for dysuria.  Musculoskeletal: Negative for falls.  Skin: Negative for rash.  Neurological: Negative for loss of consciousness and headaches.  Endo/Heme/Allergies: Negative for polydipsia.  Psychiatric/Behavioral: Positive for depression. Negative for suicidal ideas. The patient is nervous/anxious. The patient does not have insomnia.     Objective  BP 90/64  Pulse 64  Temp(Src) 97.9 F (36.6 C) (Oral)  Ht 5\' 2"  (1.575 m)  Wt 97 lb 1.3 oz  (44.035 kg)  BMI 17.75 kg/m2  SpO2 98%  LMP 06/05/2013  Physical Exam  Physical Exam  Constitutional: She is oriented to person, place, and time and well-developed, well-nourished, and in no distress. No distress.  HENT:  Head: Normocephalic and atraumatic.  Eyes: Conjunctivae are normal.  Neck: Neck supple. No thyromegaly present.  Cardiovascular: Normal rate, regular rhythm and normal heart sounds.   No murmur heard. Pulmonary/Chest: Effort normal and breath sounds normal. She has no wheezes.  Abdominal: She exhibits no distension and no mass.  Musculoskeletal: She exhibits no edema.  Lymphadenopathy:    She has no cervical adenopathy.  Neurological: She is alert and oriented to person, place, and time.  Skin: Skin is warm and dry. No rash noted. She is not diaphoretic.  Psychiatric: Memory, affect and judgment normal.    Lab Results  Component Value Date   TSH 3.901 06/26/2013   Lab Results  Component Value Date   WBC 3.7* 06/26/2013   HGB 12.7 06/26/2013   HCT 37.4 06/26/2013   MCV 88.8 06/26/2013   PLT 245 06/26/2013   Lab Results  Component Value Date   CREATININE 0.58 06/26/2013   BUN 9 06/26/2013   NA 138 06/26/2013   K 3.8 06/26/2013   CL 103 06/26/2013   CO2 28 06/26/2013   Lab Results  Component Value Date   ALT 31 06/26/2013   AST 35 06/26/2013   ALKPHOS 49 06/26/2013   BILITOT 0.5 06/26/2013   Lab Results  Component Value Date   CHOL 187 09/05/2011   Lab Results  Component Value Date   HDL 74 09/05/2011   Lab Results  Component Value Date   LDLCALC 90 09/05/2011   Lab Results  Component Value Date   TRIG 116 09/05/2011   Lab Results  Component Value Date   CHOLHDL 2.5 09/05/2011     Assessment & Plan  Anxiety and depression Patient very tearful, her marriage is not doing well and they are separated. As a result she is having increased anxiety, anorexia, difficulty concentrating. Is starting counseling and agrees to start Lexapro and may use Alprazolam prn. Spent  40 minutes with patient, 30 minutes were in counseling.   Reflux Nausea is noted with some vomiting as well ove rthe past weekend. Suspect a level of gastroenteritis complicating symptoms. Encouraged probiotics and avoid spicy and fatty foods. Report worsening symptoms. Encouraged genger prn

## 2013-06-29 NOTE — Assessment & Plan Note (Addendum)
Patient very tearful, her marriage is not doing well and they are separated. As a result she is having increased anxiety, anorexia, difficulty concentrating. Is starting counseling and agrees to start Lexapro and may use Alprazolam prn. Spent 40 minutes with patient, 30 minutes were in counseling.

## 2013-06-29 NOTE — Assessment & Plan Note (Signed)
Nausea is noted with some vomiting as well ove rthe past weekend. Suspect a level of gastroenteritis complicating symptoms. Encouraged probiotics and avoid spicy and fatty foods. Report worsening symptoms. Encouraged genger prn

## 2013-07-04 ENCOUNTER — Telehealth: Payer: Self-pay | Admitting: Family Medicine

## 2013-07-04 ENCOUNTER — Ambulatory Visit: Payer: BC Managed Care – PPO | Admitting: Family Medicine

## 2013-07-04 DIAGNOSIS — Z0000 Encounter for general adult medical examination without abnormal findings: Secondary | ICD-10-CM

## 2013-07-04 NOTE — Telephone Encounter (Signed)
[facility]  [facility]

## 2013-07-04 NOTE — Telephone Encounter (Signed)
Lab order placed.

## 2013-07-04 NOTE — Addendum Note (Signed)
Addended by: Varney Daily on: 07/04/2013 03:12 PM   Modules accepted: Orders

## 2013-08-08 ENCOUNTER — Telehealth: Payer: Self-pay | Admitting: Family Medicine

## 2013-08-08 ENCOUNTER — Ambulatory Visit (INDEPENDENT_AMBULATORY_CARE_PROVIDER_SITE_OTHER): Payer: BC Managed Care – PPO | Admitting: Family Medicine

## 2013-08-08 NOTE — Telephone Encounter (Signed)
Pt request call, resch appt today due to long wait. Does not know if she does need to be seen or not. Request to speak to md

## 2013-08-10 NOTE — Telephone Encounter (Signed)
Spoke with patient by phone, she is doing much better. Her reflux has improved with probiotics and her mood is improved with Escitaolpram. She feels calmer and agrees she does not need any further adjustments to meds at this time. We will see her for her annual exam in April unless she needs [place] sooner

## 2013-08-26 ENCOUNTER — Other Ambulatory Visit: Payer: Self-pay | Admitting: Family Medicine

## 2013-09-24 ENCOUNTER — Encounter: Payer: Self-pay | Admitting: Family Medicine

## 2013-09-24 NOTE — Telephone Encounter (Signed)
Please contact pt and advise her to schedule OV for further evaluation and recommendations.

## 2013-09-26 NOTE — Telephone Encounter (Signed)
Notified pt and she states she has appt with Dr Charlett Blake in 2 weeks and thinks she can wait until then to discuss these concerns. States Dr Charlett Blake told her to let her know of her progress on the medication (FYI).

## 2013-10-14 ENCOUNTER — Other Ambulatory Visit (HOSPITAL_COMMUNITY)
Admission: RE | Admit: 2013-10-14 | Discharge: 2013-10-14 | Disposition: A | Discharge status: Home / Self Care | Payer: BC Managed Care – PPO | Source: Ambulatory Visit | Attending: Family Medicine | Admitting: Family Medicine

## 2013-10-14 ENCOUNTER — Ambulatory Visit (INDEPENDENT_AMBULATORY_CARE_PROVIDER_SITE_OTHER): Payer: BC Managed Care – PPO | Admitting: Family Medicine

## 2013-10-14 ENCOUNTER — Encounter: Payer: Self-pay | Admitting: Family Medicine

## 2013-10-14 VITALS — BP 100/60 | HR 71 | Temp 98.0°F | Ht 62.0 in | Wt 97.0 lb

## 2013-10-14 DIAGNOSIS — K219 Gastro-esophageal reflux disease without esophagitis: Secondary | ICD-10-CM

## 2013-10-14 DIAGNOSIS — F32A Depression, unspecified: Secondary | ICD-10-CM

## 2013-10-14 DIAGNOSIS — Z01419 Encounter for gynecological examination (general) (routine) without abnormal findings: Secondary | ICD-10-CM | POA: Insufficient documentation

## 2013-10-14 DIAGNOSIS — F329 Major depressive disorder, single episode, unspecified: Secondary | ICD-10-CM

## 2013-10-14 DIAGNOSIS — Z0000 Encounter for general adult medical examination without abnormal findings: Secondary | ICD-10-CM

## 2013-10-14 DIAGNOSIS — Z124 Encounter for screening for malignant neoplasm of cervix: Secondary | ICD-10-CM | POA: Insufficient documentation

## 2013-10-14 DIAGNOSIS — D72819 Decreased white blood cell count, unspecified: Secondary | ICD-10-CM

## 2013-10-14 DIAGNOSIS — N760 Acute vaginitis: Secondary | ICD-10-CM | POA: Insufficient documentation

## 2013-10-14 DIAGNOSIS — F341 Dysthymic disorder: Secondary | ICD-10-CM

## 2013-10-14 DIAGNOSIS — R634 Abnormal weight loss: Secondary | ICD-10-CM

## 2013-10-14 DIAGNOSIS — F419 Anxiety disorder, unspecified: Secondary | ICD-10-CM

## 2013-10-14 HISTORY — DX: Encounter for screening for malignant neoplasm of cervix: Z12.4

## 2013-10-14 NOTE — Progress Notes (Signed)
Pre visit review using our clinic review tool, if applicable. No additional management support is needed unless otherwise documented below in the visit note. 

## 2013-10-14 NOTE — Progress Notes (Signed)
Patient ID: Nancy Higgins, female   DOB: Feb 10, 1976, 38 y.o.   MRN: [id number] Nancy Higgins [id number] 08-08-1975 10/14/2013      Progress Note-Follow Up  Subjective  Chief Complaint  Chief Complaint  Patient presents with  . Annual Exam    physical  . Gynecologic Exam    pap    HPI  Patient is a 38 year old female in today for routine medical care. She is in today for annual exam. She stopped Lexapro since she felt it was making her more irritable. She feels she's doing okay. She continues counseling and is trying to reconcile in her marriage. She has had  some anhedonia but no suicidal ideation. No recent or acute illness. No GYN complaints. Denies CP/palp/SOB/HA/congestion/fevers/GI or GU c/o. Taking meds as prescribed    Past Surgical History  Procedure Laterality Date  . Lithotripsy      once during pregnancy  . Cystoscopy w/ ureteral stent placement      and removal  . Laparoscopic appendectomy  12/30/2011    Procedure: APPENDECTOMY LAPAROSCOPIC;  Surgeon: Edward Jolly, MD;  Location: [facility];  Service: General;  Laterality: N/A;    Family History  Problem Relation Age of Onset  . Thyroid disease Mother   . Other Mother     uterine prolapse  . Scoliosis Mother   . Thyroid disease Father   . Thyroid disease Brother   . COPD Maternal Grandmother   . Cancer Maternal Grandfather     colon cancer  . Scoliosis Sister   . Scoliosis Sister     History   Social History  . Marital Status: Married    Spouse Name: N/A    Number of Children: N/A  . Years of Education: N/A   Occupational History  . Not on file.   Social History Main Topics  . Smoking status: Former Research scientist (life sciences)  . Smokeless tobacco: Never Used  . Alcohol Use: Yes  . Drug Use: No  . Sexual Activity: Not on file   Other Topics Concern  . Not on file   Social History Narrative  . No narrative on file    Current Outpatient Prescriptions on File Prior to Visit  Medication Sig Dispense  Refill  . ALPRAZolam (XANAX) 0.25 MG tablet 1/2 to 2 tabs daily as needed  20 tablet  1  . ibuprofen (ADVIL,MOTRIN) 200 MG tablet Take 400 mg by mouth every 6 (six) hours as needed. Patient took this medication for menstrual cramps.      . ranitidine (ZANTAC) 300 MG tablet TAKE 1 TABLET BY MOUTH EVERY NIGHT AT BEDTIME FOR 30 DAYS THEN AS NEEDED  30 tablet  0   No current facility-administered medications on file prior to visit.    No Known Allergies  Review of Systems  Review of Systems  Constitutional: Negative for fever, chills and malaise/fatigue.  HENT: Negative for congestion, hearing loss and nosebleeds.   Eyes: Negative for discharge.  Respiratory: Negative for cough, sputum production, shortness of breath and wheezing.   Cardiovascular: Negative for chest pain, palpitations and leg swelling.  Gastrointestinal: Negative for heartburn, nausea, vomiting, abdominal pain, diarrhea, constipation and blood in stool.  Genitourinary: Negative for dysuria, urgency, frequency and hematuria.  Musculoskeletal: Negative for back pain, falls and myalgias.  Skin: Negative for rash.  Neurological: Negative for dizziness, tremors, sensory change, focal weakness, loss of consciousness, weakness and headaches.  Endo/Heme/Allergies: Negative for polydipsia. Does not bruise/bleed easily.  Psychiatric/Behavioral: Negative for depression  and suicidal ideas. The patient is not nervous/anxious and does not have insomnia.     Objective  BP 100/60  Pulse 71  Temp(Src) 98 F (36.7 C) (Oral)  Ht 5\' 2"  (1.575 m)  Wt 97 lb 0.6 oz (44.017 kg)  BMI 17.74 kg/m2  SpO2 98%  LMP 10/03/2013  Physical Exam  Physical Exam  Constitutional: She is oriented to person, place, and time and well-developed, well-nourished, and in no distress. No distress.  HENT:  Head: Normocephalic and atraumatic.  Right Ear: External ear normal.  Left Ear: External ear normal.  Nose: Nose normal.  Mouth/Throat: Oropharynx  is clear and moist. No oropharyngeal exudate.  Eyes: Conjunctivae are normal. Pupils are equal, round, and reactive to light. Right eye exhibits no discharge. Left eye exhibits no discharge. No scleral icterus.  Neck: Normal range of motion. Neck supple. No thyromegaly present.  Cardiovascular: Normal rate, regular rhythm, normal heart sounds and intact distal pulses.   No murmur heard. Pulmonary/Chest: Effort normal and breath sounds normal. No respiratory distress. She has no wheezes. She has no rales.  Abdominal: Soft. Bowel sounds are normal. She exhibits no distension and no mass. There is no tenderness.  Genitourinary: Vagina normal, uterus normal, cervix normal, right adnexa normal and left adnexa normal.  No breast lesions, skin changes or masses  Musculoskeletal: Normal range of motion. She exhibits no edema and no tenderness.  Lymphadenopathy:    She has no cervical adenopathy.  Neurological: She is alert and oriented to person, place, and time. She has normal reflexes. No cranial nerve deficit. Coordination normal.  Skin: Skin is warm and dry. No rash noted. She is not diaphoretic.  Psychiatric: Mood, memory and affect normal.    Lab Results  Component Value Date   TSH 3.901 06/26/2013   Lab Results  Component Value Date   WBC 3.7* 06/26/2013   HGB 12.7 06/26/2013   HCT 37.4 06/26/2013   MCV 88.8 06/26/2013   PLT 245 06/26/2013   Lab Results  Component Value Date   CREATININE 0.58 06/26/2013   BUN 9 06/26/2013   NA 138 06/26/2013   K 3.8 06/26/2013   CL 103 06/26/2013   CO2 28 06/26/2013   Lab Results  Component Value Date   ALT 31 06/26/2013   AST 35 06/26/2013   ALKPHOS 49 06/26/2013   BILITOT 0.5 06/26/2013   Lab Results  Component Value Date   CHOL 187 09/05/2011   Lab Results  Component Value Date   HDL 74 09/05/2011   Lab Results  Component Value Date   LDLCALC 90 09/05/2011   Lab Results  Component Value Date   TRIG 116 09/05/2011   Lab Results  Component Value Date    CHOLHDL 2.5 09/05/2011     Assessment & Plan  Cervical cancer screening Pap today no concerns on exam  Anxiety and depression She stopped Lexapro secondary to increased irritability. Is continuing in counseling and is trying to reconcile her marriage. Will hold on starting a new med today and she will notify us if worsens again.  GERD (gastroesophageal reflux disease) Avoid offending foods, start probiotics. Do not eat large meals in late evening and consider raising head of bed.   Preventative health care Patient encouraged to maintain heart healthy diet, regular exercise, adequate sleep. Consider daily probiotics. Take medications as prescribed  Leukopenia Slight, will monitor with next blood draw

## 2013-10-14 NOTE — Patient Instructions (Signed)
Gastroesophageal Reflux Disease, Adult  Gastroesophageal reflux disease (GERD) happens when acid from your stomach flows up into the esophagus. When acid comes in contact with the esophagus, the acid causes soreness (inflammation) in the esophagus. Over time, GERD may create small holes (ulcers) in the lining of the esophagus.  CAUSES   · Increased body weight. This puts pressure on the stomach, making acid rise from the stomach into the esophagus.  · Smoking. This increases acid production in the stomach.  · Drinking alcohol. This causes decreased pressure in the lower esophageal sphincter (valve or ring of muscle between the esophagus and stomach), allowing acid from the stomach into the esophagus.  · Late evening meals and a full stomach. This increases pressure and acid production in the stomach.  · A malformed lower esophageal sphincter.  Sometimes, no cause is found.  SYMPTOMS   · Burning pain in the lower part of the mid-chest behind the breastbone and in the mid-stomach area. This may occur twice a week or more often.  · Trouble swallowing.  · Sore throat.  · Dry cough.  · Asthma-like symptoms including chest tightness, shortness of breath, or wheezing.  DIAGNOSIS   Your caregiver may be able to diagnose GERD based on your symptoms. In some cases, X-rays and other tests may be done to check for complications or to check the condition of your stomach and esophagus.  TREATMENT   Your caregiver may recommend over-the-counter or prescription medicines to help decrease acid production. Ask your caregiver before starting or adding any new medicines.   HOME CARE INSTRUCTIONS   · Change the factors that you can control. Ask your caregiver for guidance concerning weight loss, quitting smoking, and alcohol consumption.  · Avoid foods and drinks that make your symptoms worse, such as:  · Caffeine or alcoholic drinks.  · Chocolate.  · Peppermint or mint flavorings.  · Garlic and onions.  · Spicy foods.  · Citrus fruits,  such as oranges, lemons, or limes.  · Tomato-based foods such as sauce, chili, salsa, and pizza.  · Fried and fatty foods.  · Avoid lying down for the 3 hours prior to your bedtime or prior to taking a nap.  · Eat small, frequent meals instead of large meals.  · Wear loose-fitting clothing. Do not wear anything tight around your waist that causes pressure on your stomach.  · Raise the head of your bed 6 to 8 inches with wood blocks to help you sleep. Extra pillows will not help.  · Only take over-the-counter or prescription medicines for pain, discomfort, or fever as directed by your caregiver.  · Do not take aspirin, ibuprofen, or other nonsteroidal anti-inflammatory drugs (NSAIDs).  SEEK IMMEDIATE MEDICAL CARE IF:   · You have pain in your arms, neck, jaw, teeth, or back.  · Your pain increases or changes in intensity or duration.  · You develop nausea, vomiting, or sweating (diaphoresis).  · You develop shortness of breath, or you faint.  · Your vomit is green, yellow, black, or looks like coffee grounds or blood.  · Your stool is red, bloody, or black.  These symptoms could be signs of other problems, such as heart disease, gastric bleeding, or esophageal bleeding.  MAKE SURE YOU:   · Understand these instructions.  · Will watch your condition.  · Will get help right away if you are not doing well or get worse.  Document Released: 03/15/2005 Document Revised: 08/28/2011 Document Reviewed: 12/23/2010  [employer]® Patient   Information ©2014 [employer].

## 2013-10-19 ENCOUNTER — Encounter: Payer: Self-pay | Admitting: Family Medicine

## 2013-10-19 DIAGNOSIS — D72819 Decreased white blood cell count, unspecified: Secondary | ICD-10-CM | POA: Insufficient documentation

## 2013-10-19 DIAGNOSIS — Z0000 Encounter for general adult medical examination without abnormal findings: Secondary | ICD-10-CM

## 2013-10-19 HISTORY — DX: Encounter for general adult medical examination without abnormal findings: Z00.00

## 2013-10-19 NOTE — Assessment & Plan Note (Signed)
Slight, will monitor with next blood draw

## 2013-10-19 NOTE — Assessment & Plan Note (Addendum)
She stopped Lexapro secondary to increased irritability. Is continuing in counseling and is trying to reconcile her marriage. Will hold on starting a new med today and she will notify us if worsens again.

## 2013-10-19 NOTE — Assessment & Plan Note (Signed)
Pap today no concerns on exam

## 2013-10-19 NOTE — Assessment & Plan Note (Signed)
Patient encouraged to maintain heart healthy diet, regular exercise, adequate sleep. Consider daily probiotics. Take medications as prescribed 

## 2013-10-19 NOTE — Assessment & Plan Note (Signed)
Avoid offending foods, start probiotics. Do not eat large meals in late evening and consider raising head of bed.  

## 2013-10-20 LAB — CERVICOVAGINAL ANCILLARY ONLY
BACTERIAL VAGINITIS: NEGATIVE
CANDIDA VAGINITIS: NEGATIVE

## 2014-05-20 ENCOUNTER — Other Ambulatory Visit: Payer: Self-pay | Admitting: Family Medicine

## 2014-07-01 ENCOUNTER — Encounter: Payer: Self-pay | Admitting: Family Medicine

## 2014-07-02 ENCOUNTER — Other Ambulatory Visit: Payer: Self-pay | Admitting: Family Medicine

## 2014-07-02 DIAGNOSIS — F17210 Nicotine dependence, cigarettes, uncomplicated: Secondary | ICD-10-CM

## 2014-07-02 MED ORDER — NICOTINE 7 MG/24HR TD PT24: 7.0000 mg | MEDICATED_PATCH | Freq: Every day | TRANSDERMAL | Status: DC

## 2014-07-22 ENCOUNTER — Other Ambulatory Visit: Payer: Self-pay | Admitting: Family Medicine

## 2014-07-23 ENCOUNTER — Encounter: Payer: Self-pay | Admitting: Family Medicine

## 2014-07-23 NOTE — Telephone Encounter (Signed)
Requesting Xanax 0.25mg -Take 1/2 to 2 tablets by mouth daily as needed. Last refill:06/26/13;#20,1 Last OV:10/14/13 No CSC or UDS Please advise./AB/CMA

## 2014-07-27 NOTE — Telephone Encounter (Signed)
Last filled:  06/26/13 Amt: 20, 1 Last OV: 10/14/13 No UDS or contract on file.  Please advise.

## 2014-07-27 NOTE — Telephone Encounter (Signed)
To be able to call in for refills I need to see people every 6 months, will allow 5 tabs and after that she needs an appt to get more of the Alprazolam

## 2014-07-27 NOTE — Telephone Encounter (Signed)
Called the patient on home number [phone] left a detailed message to call back regarding her refill request.

## 2014-07-27 NOTE — Telephone Encounter (Signed)
Faxed hardcopy for Alprazolam to [facility].  The patient did return my call and I informed her of PCP instructions regarding refill.  The patient will call back to schedule appointment.

## 2014-11-04 ENCOUNTER — Encounter: Payer: Self-pay | Admitting: Family Medicine

## 2014-12-10 ENCOUNTER — Telehealth: Payer: Self-pay | Admitting: Family Medicine

## 2014-12-10 NOTE — Telephone Encounter (Signed)
Pre visit letter mailed 12/09/14 °

## 2014-12-28 ENCOUNTER — Telehealth: Payer: Self-pay | Admitting: Behavioral Health

## 2014-12-28 ENCOUNTER — Encounter: Payer: Self-pay | Admitting: Behavioral Health

## 2014-12-28 NOTE — Telephone Encounter (Signed)
Pre-Visit Call completed with patient and chart updated.   Pre-Visit Info documented in Specialty Comments under [employer].    

## 2014-12-29 ENCOUNTER — Encounter: Payer: Self-pay | Admitting: Family Medicine

## 2014-12-29 ENCOUNTER — Ambulatory Visit (INDEPENDENT_AMBULATORY_CARE_PROVIDER_SITE_OTHER): Payer: BLUE CROSS/BLUE SHIELD | Admitting: Family Medicine

## 2014-12-29 VITALS — BP 112/76 | HR 65 | Temp 98.3°F | Ht 62.0 in | Wt 96.0 lb

## 2014-12-29 DIAGNOSIS — R7989 Other specified abnormal findings of blood chemistry: Secondary | ICD-10-CM | POA: Diagnosis not present

## 2014-12-29 DIAGNOSIS — F4323 Adjustment disorder with mixed anxiety and depressed mood: Secondary | ICD-10-CM

## 2014-12-29 DIAGNOSIS — Z0000 Encounter for general adult medical examination without abnormal findings: Secondary | ICD-10-CM | POA: Diagnosis not present

## 2014-12-29 DIAGNOSIS — K5900 Constipation, unspecified: Secondary | ICD-10-CM

## 2014-12-29 DIAGNOSIS — Z87891 Personal history of nicotine dependence: Secondary | ICD-10-CM | POA: Diagnosis not present

## 2014-12-29 DIAGNOSIS — N390 Urinary tract infection, site not specified: Secondary | ICD-10-CM | POA: Diagnosis not present

## 2014-12-29 HISTORY — DX: Personal history of nicotine dependence: Z87.891

## 2014-12-29 MED ORDER — ALPRAZOLAM 0.25 MG PO TABS: 0.2500 mg | ORAL_TABLET | Freq: Two times a day (BID) | ORAL | Status: DC | PRN

## 2014-12-29 MED ORDER — RANITIDINE HCL 300 MG PO TABS
ORAL_TABLET | ORAL | Status: DC
Start: 2014-12-29 — End: 2015-08-13

## 2014-12-29 NOTE — Patient Instructions (Addendum)
Encouraged increased hydration and fiber in diet. Daily probiotics. If bowels not moving can use MOM 2 tbls po in 4 oz of warm prune juice by mouth every 2-3 days. If no results then repeat in 4 hours with  Dulcolax suppository pr, may repeat again in 4 more hours as needed. Seek care if symptoms worsen. Consider daily Miralax and/or Dulcolax if symptoms persist.   Preventive Care for Adults A healthy lifestyle and preventive care can promote health and wellness. Preventive health guidelines for women include the following key practices.  A routine yearly physical is a good way to check with your health care provider about your health and preventive screening. It is a chance to share any concerns and updates on your health and to receive a thorough exam.  Visit your dentist for a routine exam and preventive care every 6 months. Brush your teeth twice a day and floss once a day. Good oral hygiene prevents tooth decay and gum disease.  The frequency of eye exams is based on your age, health, family medical history, use of contact lenses, and other factors. Follow your health care provider's recommendations for frequency of eye exams.  Eat a healthy diet. Foods like vegetables, fruits, whole grains, low-fat dairy products, and lean protein foods contain the nutrients you need without too many calories. Decrease your intake of foods high in solid fats, added sugars, and salt. Eat the right amount of calories for you.Get information about a proper diet from your health care provider, if necessary.  Regular physical exercise is one of the most important things you can do for your health. Most adults should get at least 150 minutes of moderate-intensity exercise (any activity that increases your heart rate and causes you to sweat) each week. In addition, most adults need muscle-strengthening exercises on 2 or more days a week.  Maintain a healthy weight. The body mass index (BMI) is a screening tool to  identify possible weight problems. It provides an estimate of body fat based on height and weight. Your health care provider can find your BMI and can help you achieve or maintain a healthy weight.For adults 20 years and older:  A BMI below 18.5 is considered underweight.  A BMI of 18.5 to 24.9 is normal.  A BMI of 25 to 29.9 is considered overweight.  A BMI of 30 and above is considered obese.  Maintain normal blood lipids and cholesterol levels by exercising and minimizing your intake of saturated fat. Eat a balanced diet with plenty of fruit and vegetables. Blood tests for lipids and cholesterol should begin at age 71 and be repeated every 5 years. If your lipid or cholesterol levels are high, you are over 50, or you are at high risk for heart disease, you may need your cholesterol levels checked more frequently.Ongoing high lipid and cholesterol levels should be treated with medicines if diet and exercise are not working.  If you smoke, find out from your health care provider how to quit. If you do not use tobacco, do not start.  Lung cancer screening is recommended for adults aged 29-80 years who are at high risk for developing lung cancer because of a history of smoking. A yearly low-dose CT scan of the lungs is recommended for people who have at least a 30-pack-year history of smoking and are a current smoker or have quit within the past 15 years. A pack year of smoking is smoking an average of 1 pack of cigarettes a day for  1 year (for example: 1 pack a day for 30 years or 2 packs a day for 15 years). Yearly screening should continue until the smoker has stopped smoking for at least 15 years. Yearly screening should be stopped for people who develop a health problem that would prevent them from having lung cancer treatment.  If you are pregnant, do not drink alcohol. If you are breastfeeding, be very cautious about drinking alcohol. If you are not pregnant and choose to drink alcohol, do  not have more than 1 drink per day. One drink is considered to be 12 ounces (355 mL) of beer, 5 ounces (148 mL) of wine, or 1.5 ounces (44 mL) of liquor.  Avoid use of street drugs. Do not share needles with anyone. Ask for help if you need support or instructions about stopping the use of drugs.  High blood pressure causes heart disease and increases the risk of stroke. Your blood pressure should be checked at least every 1 to 2 years. Ongoing high blood pressure should be treated with medicines if weight loss and exercise do not work.  If you are 25-71 years old, ask your health care provider if you should take aspirin to prevent strokes.  Diabetes screening involves taking a blood sample to check your fasting blood sugar level. This should be done once every 3 years, after age 15, if you are within normal weight and without risk factors for diabetes. Testing should be considered at a younger age or be carried out more frequently if you are overweight and have at least 1 risk factor for diabetes.  Breast cancer screening is essential preventive care for women. You should practice "breast self-awareness." This means understanding the normal appearance and feel of your breasts and may include breast self-examination. Any changes detected, no matter how small, should be reported to a health care provider. Women in their 66s and 30s should have a clinical breast exam (CBE) by a health care provider as part of a regular health exam every 1 to 3 years. After age 6, women should have a CBE every year. Starting at age 66, women should consider having a mammogram (breast X-ray test) every year. Women who have a family history of breast cancer should talk to their health care provider about genetic screening. Women at a high risk of breast cancer should talk to their health care providers about having an MRI and a mammogram every year.  Breast cancer gene (BRCA)-related cancer risk assessment is recommended for  women who have family members with BRCA-related cancers. BRCA-related cancers include breast, ovarian, tubal, and peritoneal cancers. Having family members with these cancers may be associated with an increased risk for harmful changes (mutations) in the breast cancer genes BRCA1 and BRCA2. Results of the assessment will determine the need for genetic counseling and BRCA1 and BRCA2 testing.  Routine pelvic exams to screen for cancer are no longer recommended for nonpregnant women who are considered low risk for cancer of the pelvic organs (ovaries, uterus, and vagina) and who do not have symptoms. Ask your health care provider if a screening pelvic exam is right for you.  If you have had past treatment for cervical cancer or a condition that could lead to cancer, you need Pap tests and screening for cancer for at least 20 years after your treatment. If Pap tests have been discontinued, your risk factors (such as having a new sexual partner) need to be reassessed to determine if screening should be resumed. Some  women have medical problems that increase the chance of getting cervical cancer. In these cases, your health care provider may recommend more frequent screening and Pap tests.  The HPV test is an additional test that may be used for cervical cancer screening. The HPV test looks for the virus that can cause the cell changes on the cervix. The cells collected during the Pap test can be tested for HPV. The HPV test could be used to screen women aged 101 years and older, and should be used in women of any age who have unclear Pap test results. After the age of 74, women should have HPV testing at the same frequency as a Pap test.  Colorectal cancer can be detected and often prevented. Most routine colorectal cancer screening begins at the age of 80 years and continues through age 79 years. However, your health care provider may recommend screening at an earlier age if you have risk factors for colon  cancer. On a yearly basis, your health care provider may provide home test kits to check for hidden blood in the stool. Use of a small camera at the end of a tube, to directly examine the colon (sigmoidoscopy or colonoscopy), can detect the earliest forms of colorectal cancer. Talk to your health care provider about this at age 32, when routine screening begins. Direct exam of the colon should be repeated every 5-10 years through age 27 years, unless early forms of pre-cancerous polyps or small growths are found.  People who are at an increased risk for hepatitis B should be screened for this virus. You are considered at high risk for hepatitis B if:  You were born in a country where hepatitis B occurs often. Talk with your health care provider about which countries are considered high risk.  Your parents were born in a high-risk country and you have not received a shot to protect against hepatitis B (hepatitis B vaccine).  You have HIV or AIDS.  You use needles to inject street drugs.  You live with, or have sex with, someone who has hepatitis B.  You get hemodialysis treatment.  You take certain medicines for conditions like cancer, organ transplantation, and autoimmune conditions.  Hepatitis C blood testing is recommended for all people born from [birth year] through 1965 and any individual with known risks for hepatitis C.  Practice safe sex. Use condoms and avoid high-risk sexual practices to reduce the spread of sexually transmitted infections (STIs). STIs include gonorrhea, chlamydia, syphilis, trichomonas, herpes, HPV, and human immunodeficiency virus (HIV). Herpes, HIV, and HPV are viral illnesses that have no cure. They can result in disability, cancer, and death.  You should be screened for sexually transmitted illnesses (STIs) including gonorrhea and chlamydia if:  You are sexually active and are younger than 24 years.  You are older than 24 years and your health care provider tells  you that you are at risk for this type of infection.  Your sexual activity has changed since you were last screened and you are at an increased risk for chlamydia or gonorrhea. Ask your health care provider if you are at risk.  If you are at risk of being infected with HIV, it is recommended that you take a prescription medicine daily to prevent HIV infection. This is called preexposure prophylaxis (PrEP). You are considered at risk if:  You are a heterosexual woman, are sexually active, and are at increased risk for HIV infection.  You take drugs by injection.  You are sexually  active with a partner who has HIV.  Talk with your health care provider about whether you are at high risk of being infected with HIV. If you choose to begin PrEP, you should first be tested for HIV. You should then be tested every 3 months for as long as you are taking PrEP.  Osteoporosis is a disease in which the bones lose minerals and strength with aging. This can result in serious bone fractures or breaks. The risk of osteoporosis can be identified using a bone density scan. Women ages 23 years and over and women at risk for fractures or osteoporosis should discuss screening with their health care providers. Ask your health care provider whether you should take a calcium supplement or vitamin D to reduce the rate of osteoporosis.  Menopause can be associated with physical symptoms and risks. Hormone replacement therapy is available to decrease symptoms and risks. You should talk to your health care provider about whether hormone replacement therapy is right for you.  Use sunscreen. Apply sunscreen liberally and repeatedly throughout the day. You should seek shade when your shadow is shorter than you. Protect yourself by wearing long sleeves, pants, a wide-brimmed hat, and sunglasses year round, whenever you are outdoors.  Once a month, do a whole body skin exam, using a mirror to look at the skin on your back. Tell  your health care provider of new moles, moles that have irregular borders, moles that are larger than a pencil eraser, or moles that have changed in shape or color.  Stay current with required vaccines (immunizations).  Influenza vaccine. All adults should be immunized every year.  Tetanus, diphtheria, and acellular pertussis (Td, Tdap) vaccine. Pregnant women should receive 1 dose of Tdap vaccine during each pregnancy. The dose should be obtained regardless of the length of time since the last dose. Immunization is preferred during the 27th-36th week of gestation. An adult who has not previously received Tdap or who does not know her vaccine status should receive 1 dose of Tdap. This initial dose should be followed by tetanus and diphtheria toxoids (Td) booster doses every 10 years. Adults with an unknown or incomplete history of completing a 3-dose immunization series with Td-containing vaccines should begin or complete a primary immunization series including a Tdap dose. Adults should receive a Td booster every 10 years.  Varicella vaccine. An adult without evidence of immunity to varicella should receive 2 doses or a second dose if she has previously received 1 dose. Pregnant females who do not have evidence of immunity should receive the first dose after pregnancy. This first dose should be obtained before leaving the health care facility. The second dose should be obtained 4-8 weeks after the first dose.  Human papillomavirus (HPV) vaccine. Females aged 13-26 years who have not received the vaccine previously should obtain the 3-dose series. The vaccine is not recommended for use in pregnant females. However, pregnancy testing is not needed before receiving a dose. If a female is found to be pregnant after receiving a dose, no treatment is needed. In that case, the remaining doses should be delayed until after the pregnancy. Immunization is recommended for any person with an immunocompromised  condition through the age of 36 years if she did not get any or all doses earlier. During the 3-dose series, the second dose should be obtained 4-8 weeks after the first dose. The third dose should be obtained 24 weeks after the first dose and 16 weeks after the second dose.  Zoster vaccine. One dose is recommended for adults aged 25 years or older unless certain conditions are present.  Measles, mumps, and rubella (MMR) vaccine. Adults born before [birth year] generally are considered immune to measles and mumps. Adults born in [birth year] or later should have 1 or more doses of MMR vaccine unless there is a contraindication to the vaccine or there is laboratory evidence of immunity to each of the three diseases. A routine second dose of MMR vaccine should be obtained at least 28 days after the first dose for students attending postsecondary schools, health care workers, or international travelers. People who received inactivated measles vaccine or an unknown type of measles vaccine during 1963-1967 should receive 2 doses of MMR vaccine. People who received inactivated mumps vaccine or an unknown type of mumps vaccine before 1979 and are at high risk for mumps infection should consider immunization with 2 doses of MMR vaccine. For females of childbearing age, rubella immunity should be determined. If there is no evidence of immunity, females who are not pregnant should be vaccinated. If there is no evidence of immunity, females who are pregnant should delay immunization until after pregnancy. Unvaccinated health care workers born before [birth year] who lack laboratory evidence of measles, mumps, or rubella immunity or laboratory confirmation of disease should consider measles and mumps immunization with 2 doses of MMR vaccine or rubella immunization with 1 dose of MMR vaccine.  Pneumococcal 13-valent conjugate (PCV13) vaccine. When indicated, a person who is uncertain of her immunization history and has no record of immunization  should receive the PCV13 vaccine. An adult aged 6 years or older who has certain medical conditions and has not been previously immunized should receive 1 dose of PCV13 vaccine. This PCV13 should be followed with a dose of pneumococcal polysaccharide (PPSV23) vaccine. The PPSV23 vaccine dose should be obtained at least 8 weeks after the dose of PCV13 vaccine. An adult aged 28 years or older who has certain medical conditions and previously received 1 or more doses of PPSV23 vaccine should receive 1 dose of PCV13. The PCV13 vaccine dose should be obtained 1 or more years after the last PPSV23 vaccine dose.  Pneumococcal polysaccharide (PPSV23) vaccine. When PCV13 is also indicated, PCV13 should be obtained first. All adults aged 48 years and older should be immunized. An adult younger than age 53 years who has certain medical conditions should be immunized. Any person who resides in a nursing home or long-term care facility should be immunized. An adult smoker should be immunized. People with an immunocompromised condition and certain other conditions should receive both PCV13 and PPSV23 vaccines. People with human immunodeficiency virus (HIV) infection should be immunized as soon as possible after diagnosis. Immunization during chemotherapy or radiation therapy should be avoided. Routine use of PPSV23 vaccine is not recommended for American Indians, [place] Natives, or people younger than 65 years unless there are medical conditions that require PPSV23 vaccine. When indicated, people who have unknown immunization and have no record of immunization should receive PPSV23 vaccine. One-time revaccination 5 years after the first dose of PPSV23 is recommended for people aged 19-64 years who have chronic kidney failure, nephrotic syndrome, asplenia, or immunocompromised conditions. People who received 1-2 doses of PPSV23 before age 38 years should receive another dose of PPSV23 vaccine at age 42 years or later if at  least 5 years have passed since the previous dose. Doses of PPSV23 are not needed for people immunized with PPSV23 at or after age 74 years.  Meningococcal  vaccine. Adults with asplenia or persistent complement component deficiencies should receive 2 doses of quadrivalent meningococcal conjugate (MenACWY-D) vaccine. The doses should be obtained at least 2 months apart. Microbiologists working with certain meningococcal bacteria, [employer] recruits, people at risk during an outbreak, and people who travel to or live in countries with a high rate of meningitis should be immunized. A first-year college student up through age 15 years who is living in a residence hall should receive a dose if she did not receive a dose on or after her 16th birthday. Adults who have certain high-risk conditions should receive one or more doses of vaccine.  Hepatitis A vaccine. Adults who wish to be protected from this disease, have certain high-risk conditions, work with hepatitis A-infected animals, work in hepatitis A research labs, or travel to or work in countries with a high rate of hepatitis A should be immunized. Adults who were previously unvaccinated and who anticipate close contact with an international adoptee during the first 60 days after arrival in the [place] from a country with a high rate of hepatitis A should be immunized.  Hepatitis B vaccine. Adults who wish to be protected from this disease, have certain high-risk conditions, may be exposed to blood or other infectious body fluids, are household contacts or sex partners of hepatitis B positive people, are clients or workers in certain care facilities, or travel to or work in countries with a high rate of hepatitis B should be immunized.  Haemophilus influenzae type b (Hib) vaccine. A previously unvaccinated person with asplenia or sickle cell disease or having a scheduled splenectomy should receive 1 dose of Hib vaccine. Regardless of previous  immunization, a recipient of a hematopoietic stem cell transplant should receive a 3-dose series 6-12 months after her successful transplant. Hib vaccine is not recommended for adults with HIV infection. Preventive Services / Frequency Ages 55 to 63 years  Blood pressure check.** / Every 1 to 2 years.  Lipid and cholesterol check.** / Every 5 years beginning at age 72.  Clinical breast exam.** / Every 3 years for women in their 66s and 52s.  BRCA-related cancer risk assessment.** / For women who have family members with a BRCA-related cancer (breast, ovarian, tubal, or peritoneal cancers).  Pap test.** / Every 2 years from ages 75 through 28. Every 3 years starting at age 43 through age 23 or 79 with a history of 3 consecutive normal Pap tests.  HPV screening.** / Every 3 years from ages 1 through ages 10 to 70 with a history of 3 consecutive normal Pap tests.  Hepatitis C blood test.** / For any individual with known risks for hepatitis C.  Skin self-exam. / Monthly.  Influenza vaccine. / Every year.  Tetanus, diphtheria, and acellular pertussis (Tdap, Td) vaccine.** / Consult your health care provider. Pregnant women should receive 1 dose of Tdap vaccine during each pregnancy. 1 dose of Td every 10 years.  Varicella vaccine.** / Consult your health care provider. Pregnant females who do not have evidence of immunity should receive the first dose after pregnancy.  HPV vaccine. / 3 doses over 6 months, if 22 and younger. The vaccine is not recommended for use in pregnant females. However, pregnancy testing is not needed before receiving a dose.  Measles, mumps, rubella (MMR) vaccine.** / You need at least 1 dose of MMR if you were born in 1957 or later. You may also need a 2nd dose. For females of childbearing age, rubella immunity should be  determined. If there is no evidence of immunity, females who are not pregnant should be vaccinated. If there is no evidence of immunity, females who  are pregnant should delay immunization until after pregnancy.  Pneumococcal 13-valent conjugate (PCV13) vaccine.** / Consult your health care provider.  Pneumococcal polysaccharide (PPSV23) vaccine.** / 1 to 2 doses if you smoke cigarettes or if you have certain conditions.  Meningococcal vaccine.** / 1 dose if you are age 60 to 33 years and a [employer] researcher living in a residence hall, or have one of several medical conditions, you need to get vaccinated against meningococcal disease. You may also need additional booster doses.  Hepatitis A vaccine.** / Consult your health care provider.  Hepatitis B vaccine.** / Consult your health care provider.  Haemophilus influenzae type b (Hib) vaccine.** / Consult your health care provider. Ages 7 to 55 years  Blood pressure check.** / Every 1 to 2 years.  Lipid and cholesterol check.** / Every 5 years beginning at age 90 years.  Lung cancer screening. / Every year if you are aged 16-80 years and have a 30-pack-year history of smoking and currently smoke or have quit within the past 15 years. Yearly screening is stopped once you have quit smoking for at least 15 years or develop a health problem that would prevent you from having lung cancer treatment.  Clinical breast exam.** / Every year after age 50 years.  BRCA-related cancer risk assessment.** / For women who have family members with a BRCA-related cancer (breast, ovarian, tubal, or peritoneal cancers).  Mammogram.** / Every year beginning at age 53 years and continuing for as long as you are in good health. Consult with your health care provider.  Pap test.** / Every 3 years starting at age 63 years through age 32 or 79 years with a history of 3 consecutive normal Pap tests.  HPV screening.** / Every 3 years from ages 65 years through ages 52 to 57 years with a history of 3 consecutive normal Pap tests.  Fecal occult blood test (FOBT) of stool. / Every year beginning at age  4 years and continuing until age 62 years. You may not need to do this test if you get a colonoscopy every 10 years.  Flexible sigmoidoscopy or colonoscopy.** / Every 5 years for a flexible sigmoidoscopy or every 10 years for a colonoscopy beginning at age 36 years and continuing until age 33 years.  Hepatitis C blood test.** / For all people born from [birth year] through 1964 and any individual with known risks for hepatitis C.  Skin self-exam. / Monthly.  Influenza vaccine. / Every year.  Tetanus, diphtheria, and acellular pertussis (Tdap/Td) vaccine.** / Consult your health care provider. Pregnant women should receive 1 dose of Tdap vaccine during each pregnancy. 1 dose of Td every 10 years.  Varicella vaccine.** / Consult your health care provider. Pregnant females who do not have evidence of immunity should receive the first dose after pregnancy.  Zoster vaccine.** / 1 dose for adults aged 21 years or older.  Measles, mumps, rubella (MMR) vaccine.** / You need at least 1 dose of MMR if you were born in 1957 or later. You may also need a 2nd dose. For females of childbearing age, rubella immunity should be determined. If there is no evidence of immunity, females who are not pregnant should be vaccinated. If there is no evidence of immunity, females who are pregnant should delay immunization until after pregnancy.  Pneumococcal 13-valent conjugate (PCV13) vaccine.** /  Consult your health care provider.  Pneumococcal polysaccharide (PPSV23) vaccine.** / 1 to 2 doses if you smoke cigarettes or if you have certain conditions.  Meningococcal vaccine.** / Consult your health care provider.  Hepatitis A vaccine.** / Consult your health care provider.  Hepatitis B vaccine.** / Consult your health care provider.  Haemophilus influenzae type b (Hib) vaccine.** / Consult your health care provider. Ages 5 years and over  Blood pressure check.** / Every 1 to 2 years.  Lipid and cholesterol  check.** / Every 5 years beginning at age 30 years.  Lung cancer screening. / Every year if you are aged 81-80 years and have a 30-pack-year history of smoking and currently smoke or have quit within the past 15 years. Yearly screening is stopped once you have quit smoking for at least 15 years or develop a health problem that would prevent you from having lung cancer treatment.  Clinical breast exam.** / Every year after age 24 years.  BRCA-related cancer risk assessment.** / For women who have family members with a BRCA-related cancer (breast, ovarian, tubal, or peritoneal cancers).  Mammogram.** / Every year beginning at age 60 years and continuing for as long as you are in good health. Consult with your health care provider.  Pap test.** / Every 3 years starting at age 27 years through age 65 or 12 years with 3 consecutive normal Pap tests. Testing can be stopped between 65 and 70 years with 3 consecutive normal Pap tests and no abnormal Pap or HPV tests in the past 10 years.  HPV screening.** / Every 3 years from ages 46 years through ages 42 or 81 years with a history of 3 consecutive normal Pap tests. Testing can be stopped between 65 and 70 years with 3 consecutive normal Pap tests and no abnormal Pap or HPV tests in the past 10 years.  Fecal occult blood test (FOBT) of stool. / Every year beginning at age 55 years and continuing until age 84 years. You may not need to do this test if you get a colonoscopy every 10 years.  Flexible sigmoidoscopy or colonoscopy.** / Every 5 years for a flexible sigmoidoscopy or every 10 years for a colonoscopy beginning at age 89 years and continuing until age 39 years.  Hepatitis C blood test.** / For all people born from [birth year] through 1964 and any individual with known risks for hepatitis C.  Osteoporosis screening.** / A one-time screening for women ages 58 years and over and women at risk for fractures or osteoporosis.  Skin self-exam. /  Monthly.  Influenza vaccine. / Every year.  Tetanus, diphtheria, and acellular pertussis (Tdap/Td) vaccine.** / 1 dose of Td every 10 years.  Varicella vaccine.** / Consult your health care provider.  Zoster vaccine.** / 1 dose for adults aged 17 years or older.  Pneumococcal 13-valent conjugate (PCV13) vaccine.** / Consult your health care provider.  Pneumococcal polysaccharide (PPSV23) vaccine.** / 1 dose for all adults aged 53 years and older.  Meningococcal vaccine.** / Consult your health care provider.  Hepatitis A vaccine.** / Consult your health care provider.  Hepatitis B vaccine.** / Consult your health care provider.  Haemophilus influenzae type b (Hib) vaccine.** / Consult your health care provider. ** Family history and personal history of risk and conditions may change your health care provider's recommendations. Document Released: 08/01/2001 Document Revised: 10/20/2013 Document Reviewed: 10/31/2010 [facility] Patient Information 2015 [place], [place]. This information is not intended to replace advice given to you by your health  care provider. Make sure you discuss any questions you have with your health care provider.

## 2014-12-29 NOTE — Progress Notes (Signed)
Pre visit review using our clinic review tool, if applicable. No additional management support is needed unless otherwise documented below in the visit note. 

## 2014-12-30 ENCOUNTER — Encounter: Payer: Self-pay | Admitting: Internal Medicine

## 2014-12-30 LAB — COMPREHENSIVE METABOLIC PANEL
ALBUMIN: 4.4 g/dL (ref 3.5–5.2)
ALT: 12 U/L (ref 0–35)
AST: 18 U/L (ref 0–37)
Alkaline Phosphatase: 51 U/L (ref 39–117)
BUN: 9 mg/dL (ref 6–23)
CALCIUM: 9.3 mg/dL (ref 8.4–10.5)
CHLORIDE: 104 meq/L (ref 96–112)
CO2: 26 meq/L (ref 19–32)
Creatinine, Ser: 0.83 mg/dL (ref 0.40–1.20)
GFR: 81.98 mL/min (ref >60.00)
Glucose, Bld: 92 mg/dL (ref 70–99)
Potassium: 3.7 mEq/L (ref 3.5–5.1)
Sodium: 139 mEq/L (ref 135–145)
TOTAL PROTEIN: 7.2 g/dL (ref 6.0–8.3)
Total Bilirubin: 0.4 mg/dL (ref 0.2–1.2)

## 2014-12-30 LAB — URINALYSIS, ROUTINE W REFLEX MICROSCOPIC
Bilirubin Urine: NEGATIVE
Ketones, ur: NEGATIVE
LEUKOCYTES UA: NEGATIVE
NITRITE: NEGATIVE
PH: 5.5 (ref 5.0–8.0)
SPECIFIC GRAVITY, URINE: 1.025 (ref 1.000–1.030)
Total Protein, Urine: NEGATIVE
UROBILINOGEN UA: 0.2 (ref 0.0–1.0)
Urine Glucose: NEGATIVE

## 2014-12-30 LAB — CBC
HEMATOCRIT: 39.1 % (ref 36.0–46.0)
Hemoglobin: 13.1 g/dL (ref 12.0–15.0)
MCHC: 33.4 g/dL (ref 30.0–36.0)
MCV: 94.8 fl (ref 78.0–100.0)
Platelets: 234 10*3/uL (ref 150.0–400.0)
RBC: 4.13 Mil/uL (ref 3.87–5.11)
RDW: 12.7 % (ref 11.5–15.5)
WBC: 7.9 10*3/uL (ref 4.0–10.5)

## 2014-12-30 LAB — TSH: TSH: 6.42 u[IU]/mL — ABNORMAL HIGH (ref 0.35–4.50)

## 2014-12-31 ENCOUNTER — Other Ambulatory Visit: Payer: Self-pay | Admitting: Family Medicine

## 2014-12-31 ENCOUNTER — Other Ambulatory Visit (INDEPENDENT_AMBULATORY_CARE_PROVIDER_SITE_OTHER): Payer: BLUE CROSS/BLUE SHIELD

## 2014-12-31 DIAGNOSIS — R946 Abnormal results of thyroid function studies: Secondary | ICD-10-CM | POA: Diagnosis not present

## 2014-12-31 DIAGNOSIS — R7989 Other specified abnormal findings of blood chemistry: Secondary | ICD-10-CM

## 2014-12-31 LAB — URINE CULTURE
Colony Count: NO GROWTH
Organism ID, Bacteria: NO GROWTH

## 2014-12-31 LAB — T4, FREE: Free T4: 0.68 ng/dL (ref 0.60–1.60)

## 2014-12-31 NOTE — Telephone Encounter (Signed)
Labs entered and lab appt. Made.

## 2015-01-10 ENCOUNTER — Encounter: Payer: Self-pay | Admitting: Family Medicine

## 2015-01-10 DIAGNOSIS — R7989 Other specified abnormal findings of blood chemistry: Secondary | ICD-10-CM

## 2015-01-10 DIAGNOSIS — E039 Hypothyroidism, unspecified: Secondary | ICD-10-CM | POA: Insufficient documentation

## 2015-01-10 DIAGNOSIS — N390 Urinary tract infection, site not specified: Secondary | ICD-10-CM | POA: Insufficient documentation

## 2015-01-10 DIAGNOSIS — Z87440 Personal history of urinary (tract) infections: Secondary | ICD-10-CM | POA: Insufficient documentation

## 2015-01-10 HISTORY — DX: Other specified abnormal findings of blood chemistry: R79.89

## 2015-01-10 NOTE — Assessment & Plan Note (Signed)
Urine culture negative for uti, encouraged probiotics and good fluid intake

## 2015-01-10 NOTE — Assessment & Plan Note (Signed)
Encouraged increased hydration and fiber in diet. Daily probiotics. If bowels not moving can use MOM 2 tbls po in 4 oz of warm prune juice by mouth every 2-3 days. If no results then repeat in 4 hours with  Dulcolax suppository pr, may repeat again in 4 more hours as needed. Seek care if symptoms worsen. Consider daily Miralax and/or Dulcolax if symptoms persist.  

## 2015-01-10 NOTE — Assessment & Plan Note (Signed)
Patient encouraged to maintain heart healthy diet, regular exercise, adequate sleep. Consider daily probiotics. Take medications as prescribed. Given and reviewed copy of ACP documents from [place] Secretary of State and encouraged to complete and return 

## 2015-01-10 NOTE — Assessment & Plan Note (Signed)
But normal freeT4, will repeat labs in 3 months

## 2015-01-10 NOTE — Progress Notes (Signed)
Nancy Higgins  [id number] 1975-06-27 01/10/2015      Progress Note-Follow Up  Subjective  Chief Complaint  Chief Complaint  Patient presents with  . Annual Exam    HPI  Patient is a 39 y.o. female in today for routine medical care. She is here today for annual exam. She reports she has not had a cigarette since January of 2016. Does note some recent flares in constipation and heart burn. Has had some gas pains/cramping when she is backed up but is feeling well today. Has used stool softeners and enemas with some relief as well as gas x for cramping. No bloody or tarry stool. Denies CP/palp/SOB/HA/congestion/fevers. Taking meds as prescribed    Past Surgical History  Procedure Laterality Date  . Lithotripsy      once during pregnancy  . Cystoscopy w/ ureteral stent placement      and removal  . Laparoscopic appendectomy  12/30/2011    Procedure: APPENDECTOMY LAPAROSCOPIC;  Surgeon: Edward Jolly, MD;  Location: [facility];  Service: General;  Laterality: N/A;    Family History  Problem Relation Age of Onset  . Thyroid disease Mother   . Other Mother     uterine prolapse  . Scoliosis Mother   . Thyroid disease Father   . Thyroid disease Brother   . COPD Maternal Grandmother   . Cancer Maternal Grandfather     colon cancer  . Scoliosis Sister   . Scoliosis Sister   . Other Daughter     papilledema  . Other Maternal Aunt     fungal meningitis, leukocytopenia    History   Social History  . Marital Status: Married    Spouse Name: N/A  . Number of Children: N/A  . Years of Education: N/A   Occupational History  . Not on file.   Social History Main Topics  . Smoking status: Former Research scientist (life sciences)  . Smokeless tobacco: Never Used  . Alcohol Use: Yes     Comment: occasional  . Drug Use: No  . Sexual Activity: Not on file     Comment: lives with husband and daughters, no dietary restrictions   Other Topics Concern  . Not on file   Social History Narrative     Current Outpatient Prescriptions on File Prior to Visit  Medication Sig Dispense Refill  . ibuprofen (ADVIL,MOTRIN) 200 MG tablet Take 400 mg by mouth every 6 (six) hours as needed. Patient took this medication for menstrual cramps.     No current facility-administered medications on file prior to visit.    No Known Allergies  Review of Systems  Review of Systems  Constitutional: Negative for fever, chills and malaise/fatigue.  HENT: Negative for congestion, hearing loss and nosebleeds.   Eyes: Negative for discharge.  Respiratory: Negative for cough, sputum production, shortness of breath and wheezing.   Cardiovascular: Negative for chest pain, palpitations and leg swelling.  Gastrointestinal: Positive for abdominal pain and constipation. Negative for heartburn, nausea, vomiting, diarrhea and blood in stool.  Genitourinary: Positive for dysuria. Negative for urgency, frequency and hematuria.  Musculoskeletal: Negative for myalgias, back pain and falls.  Skin: Negative for rash.  Neurological: Negative for dizziness, tremors, sensory change, focal weakness, loss of consciousness, weakness and headaches.  Endo/Heme/Allergies: Negative for polydipsia. Does not bruise/bleed easily.  Psychiatric/Behavioral: Negative for depression and suicidal ideas. The patient is not nervous/anxious and does not have insomnia.     Objective  BP 112/76 mmHg  Pulse 65  Temp(Src) 98.3  F (36.8 C) (Oral)  Ht 5\' 2"  (1.575 m)  Wt 96 lb (43.545 kg)  BMI 17.55 kg/m2  SpO2 99%  LMP 12/15/2014  Physical Exam  Physical Exam  Constitutional: She is oriented to person, place, and time and well-developed, well-nourished, and in no distress. No distress.  HENT:  Head: Normocephalic and atraumatic.  Eyes: Conjunctivae are normal.  Neck: Neck supple. No thyromegaly present.  Cardiovascular: Normal rate, regular rhythm and normal heart sounds.   No murmur heard. Pulmonary/Chest: Effort normal and  breath sounds normal. She has no wheezes.  Abdominal: She exhibits no distension and no mass.  Musculoskeletal: She exhibits no edema.  Lymphadenopathy:    She has no cervical adenopathy.  Neurological: She is alert and oriented to person, place, and time.  Skin: Skin is warm and dry. No rash noted. She is not diaphoretic.  Psychiatric: Memory, affect and judgment normal.    Lab Results  Component Value Date   TSH 6.42* 12/29/2014   Lab Results  Component Value Date   WBC 7.9 12/29/2014   HGB 13.1 12/29/2014   HCT 39.1 12/29/2014   MCV 94.8 12/29/2014   PLT 234.0 12/29/2014   Lab Results  Component Value Date   CREATININE 0.83 12/29/2014   BUN 9 12/29/2014   NA 139 12/29/2014   K 3.7 12/29/2014   CL 104 12/29/2014   CO2 26 12/29/2014   Lab Results  Component Value Date   ALT 12 12/29/2014   AST 18 12/29/2014   ALKPHOS 51 12/29/2014   BILITOT 0.4 12/29/2014   Lab Results  Component Value Date   CHOL 187 09/05/2011   Lab Results  Component Value Date   HDL 74 09/05/2011   Lab Results  Component Value Date   LDLCALC 90 09/05/2011   Lab Results  Component Value Date   TRIG 116 09/05/2011   Lab Results  Component Value Date   CHOLHDL 2.5 09/05/2011     Assessment & Plan  Preventative health care Patient encouraged to maintain heart healthy diet, regular exercise, adequate sleep. Consider daily probiotics. Take medications as prescribed. Given and reviewed copy of ACP documents from [facility] and encouraged to complete and return  Constipation Encouraged increased hydration and fiber in diet. Daily probiotics. If bowels not moving can use MOM 2 tbls po in 4 oz of warm prune juice by mouth every 2-3 days. If no results then repeat in 4 hours with  Dulcolax suppository pr, may repeat again in 4 more hours as needed. Seek care if symptoms worsen. Consider daily Miralax and/or Dulcolax if symptoms persist.   Urinary tract infectious disease Urine  culture negative for uti, encouraged probiotics and good fluid intake  Abnormal TSH But normal freeT4, will repeat labs in 3 months

## 2015-02-02 ENCOUNTER — Encounter: Payer: Self-pay | Admitting: *Deleted

## 2015-03-04 ENCOUNTER — Ambulatory Visit: Payer: BLUE CROSS/BLUE SHIELD | Admitting: Gastroenterology

## 2015-03-05 ENCOUNTER — Encounter: Payer: Self-pay | Admitting: Internal Medicine

## 2015-03-05 ENCOUNTER — Ambulatory Visit (INDEPENDENT_AMBULATORY_CARE_PROVIDER_SITE_OTHER): Payer: BLUE CROSS/BLUE SHIELD | Admitting: Internal Medicine

## 2015-03-05 VITALS — BP 96/64 | HR 56 | Ht 63.39 in | Wt 98.2 lb

## 2015-03-05 DIAGNOSIS — K5900 Constipation, unspecified: Secondary | ICD-10-CM

## 2015-03-05 DIAGNOSIS — K5909 Other constipation: Secondary | ICD-10-CM

## 2015-03-05 DIAGNOSIS — K589 Irritable bowel syndrome without diarrhea: Secondary | ICD-10-CM

## 2015-03-05 MED ORDER — LINACLOTIDE 145 MCG PO CAPS: 145.0000 ug | ORAL_CAPSULE | Freq: Every day | ORAL | Status: DC

## 2015-03-05 NOTE — Progress Notes (Signed)
Patient ID: Nancy Higgins, female   DOB: [birth year], 39 y.o.   MRN: [id number] HPI: Nancy Higgins is a 39 year old female with chronic constipation, intermittent GERD symptoms, abnormal TSH who is seen in consultation at the request of Dr. Charlett Blake evaluate chronic constipation. She is here alone today. She reports that she is had long-standing chronic constipation for her "whole life". She remember if this dating back to when she was a teenager. She has a bowel movement approximately once per week and this causes her to feel constipated. Constipation for her includes abdominal fullness, increased gas, left-sided abdominal discomfort. Decreased appetite. She often feels much better after a bowel movement. Has intermittent nausea but no vomiting. No dysphagia. Intermittent heartburn and she does use ranitidine 300 mg at bedtime on occasion but not consistently. She has tried [place] which she did not find helpful. Has tried over-the-counter senna. Most recently she is using docusate 300 mg approximately every other day and intermittent fleets enemas. She almost always has to use some form of laxative in order to have a bowel movement. At times despite using laxative she feels like her evacuation is incomplete. She's had no rectal bleeding or melena. No change in her bowel movements. She denies a family history of colorectal cancer in any first-degree relative. Her maternal grandfather did have colorectal cancer in his 74s. She reports regular menses. Former tobacco use but none in the last 9 months.    Past Medical History  Diagnosis Date  . GERD (gastroesophageal reflux disease)   . Anxiety   . Allergy   . Constipation   . Anemia     with pregnancy  . History of renal stone   . Anxiety and depression   . Sinusitis 05/22/2012  . Reflux 05/22/2012  . Constipation 05/22/2012  . LOM (left otitis media) 05/24/2012  . Cervical cancer screening 10/14/2013    Menarche at 13 Regular and moderate flow,  dysmenorrhea intermittently no history of abnormal pap in past G2P2, s/p 2 svd No history of MGM concerns today include increased vaginal discharge increased over past few months, no odor, clear Last pap 2 1/2 years ago, normal, no concerns LMP 10/03/13 No gyn surgeries   . Preventative health care 10/19/2013  . History of tobacco abuse 12/29/2014    Last cigarette in January 2016 Used patch   . Abnormal TSH 01/10/2015    Past Surgical History  Procedure Laterality Date  . Lithotripsy      once during pregnancy  . Cystoscopy w/ ureteral stent placement      and removal  . Laparoscopic appendectomy  12/30/2011    Procedure: APPENDECTOMY LAPAROSCOPIC;  Surgeon: Edward Jolly, MD;  Location: [facility];  Service: General;  Laterality: N/A;    Outpatient Prescriptions Prior to Visit  Medication Sig Dispense Refill  . ALPRAZolam (XANAX) 0.25 MG tablet Take 1 tablet (0.25 mg total) by mouth 2 (two) times daily as needed for anxiety. 5 tablet 1  . ibuprofen (ADVIL,MOTRIN) 200 MG tablet Take 400 mg by mouth every 6 (six) hours as needed. Patient took this medication for menstrual cramps.    . ranitidine (ZANTAC) 300 MG tablet TAKE 1 TABLET BY MOUTH EVERY NIGHT AT BEDTIME FOR 30 DAYS 30 tablet 11   No facility-administered medications prior to visit.    No Known Allergies  Family History  Problem Relation Age of Onset  . Thyroid disease Mother   . Other Mother     uterine prolapse  . Scoliosis Mother   .  Thyroid disease Father   . Thyroid disease Brother   . COPD Maternal Grandmother   . Colon cancer Maternal Grandfather   . Scoliosis Sister   . Scoliosis Sister   . Other Daughter     papilledema  . Other Maternal Aunt     fungal meningitis, leukocytopenia    Social History  Substance Use Topics  . Smoking status: Former Research scientist (life sciences)  . Smokeless tobacco: Never Used  . Alcohol Use: Yes     Comment: occasional    ROS: As per history of present illness, otherwise negative  BP  96/64 mmHg  Pulse 56  Ht 5' 3.39" (1.61 m)  Wt 98 lb 4 oz (44.566 kg)  BMI 17.19 kg/m2  LMP 02/02/2015 (Approximate) Constitutional: Well-developed and well-nourished. No distress. HEENT: Normocephalic and atraumatic. Oropharynx is clear and moist. No oropharyngeal exudate. Conjunctivae are normal.  No scleral icterus. Neck: Neck supple. Trachea midline. Cardiovascular: Normal rate, regular rhythm and intact distal pulses. No M/R/G Pulmonary/chest: Effort normal and breath sounds normal. No wheezing, rales or rhonchi. Abdominal: Soft, thin, nontender, nondistended. Bowel sounds active throughout. There are no masses palpable. No hepatosplenomegaly. Extremities: no clubbing, cyanosis, or edema Lymphadenopathy: No cervical adenopathy noted. Neurological: Alert and oriented to person place and time. Skin: Skin is warm and dry. No rashes noted. Psychiatric: Normal mood and affect. Behavior is normal.  RELEVANT LABS AND IMAGING: CBC    Component Value Date/Time   WBC 7.9 12/29/2014 1701   RBC 4.13 12/29/2014 1701   HGB 13.1 12/29/2014 1701   HCT 39.1 12/29/2014 1701   PLT 234.0 12/29/2014 1701   MCV 94.8 12/29/2014 1701   MCH 30.2 06/26/2013 0901   MCHC 33.4 12/29/2014 1701   RDW 12.7 12/29/2014 1701    CMP     Component Value Date/Time   NA 139 12/29/2014 1701   K 3.7 12/29/2014 1701   CL 104 12/29/2014 1701   CO2 26 12/29/2014 1701   GLUCOSE 92 12/29/2014 1701   BUN 9 12/29/2014 1701   CREATININE 0.83 12/29/2014 1701   CREATININE 0.58 06/26/2013 0901   CALCIUM 9.3 12/29/2014 1701   PROT 7.2 12/29/2014 1701   ALBUMIN 4.4 12/29/2014 1701   AST 18 12/29/2014 1701   ALT 12 12/29/2014 1701   ALKPHOS 51 12/29/2014 1701   BILITOT 0.4 12/29/2014 1701   GFRNONAA >90 12/29/2011 1857   GFRAA >90 12/29/2011 1857    ASSESSMENT/PLAN: 39 year old female with chronic constipation, intermittent GERD symptoms, abnormal TSH who is seen in consultation at the request of Dr. Charlett Blake  evaluate chronic constipation.  1. Chronic constipation with probable IBS -- no alarm symptoms. Constipation is long-standing. TSH recently mildly elevated but T3 normal. Constipation certainly can worsen with hypothyroidism. Dr. Charlett Blake has plans to repeat thyroid testing next month. We discussed chronic constipation in general and treatment options. After discussion we decided to try Linzess 145 g daily. This is best 30 minutes before breakfast. Side effect would be diarrhea and she is asked to notify me if this occurs. If ineffective can increase the dose to 290 g daily. I will see her back in 2-3 months for reassessment.  2. GERD -- intermittent and inconsistent symptoms. We'll continue as needed Zantac  3. Elevated TSH -- very slightly elevated with normal measured thyroid hormone. Repeat TSH with primary care next month.    QJ:[id number] A Blyth, [address] [place], [place] [address]

## 2015-03-05 NOTE — Patient Instructions (Signed)
We have sent the following medications to your pharmacy for you to pick up at your convenience: Linzess 145 mcg daily (call our office if this is ineffective)  Please follow up with Dr Hilarie Fredrickson in 3-4 months.

## 2015-04-02 ENCOUNTER — Other Ambulatory Visit (INDEPENDENT_AMBULATORY_CARE_PROVIDER_SITE_OTHER): Payer: BLUE CROSS/BLUE SHIELD

## 2015-04-02 DIAGNOSIS — R946 Abnormal results of thyroid function studies: Secondary | ICD-10-CM | POA: Diagnosis not present

## 2015-04-02 DIAGNOSIS — R7989 Other specified abnormal findings of blood chemistry: Secondary | ICD-10-CM

## 2015-04-02 LAB — T4, FREE: FREE T4: 0.66 ng/dL (ref 0.60–1.60)

## 2015-04-02 LAB — TSH: TSH: 2.56 u[IU]/mL (ref 0.35–4.50)

## 2015-04-11 ENCOUNTER — Encounter: Payer: Self-pay | Admitting: Internal Medicine

## 2015-04-13 NOTE — Telephone Encounter (Signed)
Thanks for the email Would recommend increasing Linzess to 290 MCG daily Let me know if diarrhea or ineffective or any other issues related to the medicine

## 2015-04-14 ENCOUNTER — Other Ambulatory Visit: Payer: Self-pay

## 2015-04-14 MED ORDER — LINACLOTIDE 290 MCG PO CAPS: 290.0000 ug | ORAL_CAPSULE | Freq: Every day | ORAL | Status: DC

## 2015-04-28 ENCOUNTER — Encounter: Payer: Self-pay | Admitting: Family Medicine

## 2015-04-30 ENCOUNTER — Encounter: Payer: Self-pay | Admitting: Physician Assistant

## 2015-04-30 ENCOUNTER — Ambulatory Visit (INDEPENDENT_AMBULATORY_CARE_PROVIDER_SITE_OTHER): Payer: BLUE CROSS/BLUE SHIELD | Admitting: Physician Assistant

## 2015-04-30 VITALS — BP 104/72 | HR 60 | Temp 98.4°F | Resp 16 | Ht 63.0 in | Wt 100.0 lb

## 2015-04-30 DIAGNOSIS — R1033 Periumbilical pain: Secondary | ICD-10-CM | POA: Insufficient documentation

## 2015-04-30 MED ORDER — MELOXICAM 15 MG PO TABS: 15.0000 mg | ORAL_TABLET | Freq: Every day | ORAL | Status: DC

## 2015-04-30 NOTE — Assessment & Plan Note (Signed)
No palpable hernia. No current constipation or hardened stools. Suspect muscular strain. Rx Mobic. Supportive measures reviewed. Follow-up next week. If not resolving, will need US abdomen.

## 2015-04-30 NOTE — Patient Instructions (Signed)
Please stay well hydrated.  Continue Linzess and chronic medications. Take Mobic daily with food to calm inflammation. Avoid heavy lifting or overexertion. If symptoms are not improving or resolved by early next week, call me and we will set up imaging.

## 2015-04-30 NOTE — Progress Notes (Signed)
Pre visit review using our clinic review tool, if applicable. No additional management support is needed unless otherwise documented below in the visit note/SLS  

## 2015-04-30 NOTE — Progress Notes (Signed)
Patient presents to clinic today c/o pain around umbilicus over the past couple of weeks. Started out as mild intermittent ache after carrying heavy objects.  Over the past week has become more severe and constant. Denies radiation of pain. Denies change to bowel/bladder habits, but endorses history of chronic constipation. Is currently taking Linzess, starting medication about 1 month ago with improvement in constipation. Is s/p appendectomy. Endorses a similar pain after appendectomy but much more severe.   Past Medical History  Diagnosis Date  . GERD (gastroesophageal reflux disease)   . Anxiety   . Allergy   . Constipation   . Anemia     with pregnancy  . History of renal stone   . Anxiety and depression   . Sinusitis 05/22/2012  . Reflux 05/22/2012  . Constipation 05/22/2012  . LOM (left otitis media) 05/24/2012  . Cervical cancer ([place]) 10/14/2013    Menarche at 13 Regular and moderate flow, dysmenorrhea intermittently no history of abnormal pap in past G2P2, s/p 2 svd No history of MGM concerns today include increased vaginal discharge increased over past few months, no odor, clear Last pap 2 1/2 years ago, normal, no concerns LMP 10/03/13  gyn surgeries  . Cervical cancer screening 10/14/2013    Menarche at 13 Regular and moderate flow, dysmenorrhea intermittently no history of abnormal pap in past G2P2, s/p 2 svd No history of MGM concerns today include increased vaginal discharge increased over past few months, no odor, clear Last pap 2 1/2 years ago, normal, no concerns LMP 10/03/13 No gyn surgeries   . Preventative health care 10/19/2013  . History of tobacco abuse 12/29/2014    Last cigarette in January 2016 Used patch   . Abnormal TSH 01/10/2015    Current Outpatient Prescriptions on File Prior to Visit  Medication Sig Dispense Refill  . ALPRAZolam (XANAX) 0.25 MG tablet Take 1 tablet (0.25 mg total) by mouth 2 (two) times daily as needed for anxiety. 5 tablet 1  . docusate sodium  (COLACE) 100 MG capsule Take 100 mg by mouth daily as needed for mild constipation.    Marland Kitchen ibuprofen (ADVIL,MOTRIN) 200 MG tablet Take 400 mg by mouth every 6 (six) hours as needed. Patient took this medication for menstrual cramps.    . Linaclotide (LINZESS) 290 MCG CAPS capsule Take 1 capsule (290 mcg total) by mouth daily. 30 capsule 3  . ranitidine (ZANTAC) 300 MG tablet TAKE 1 TABLET BY MOUTH EVERY NIGHT AT BEDTIME FOR 30 DAYS 30 tablet 11  . sodium phosphate (FLEET) enema Place 1 enema rectally as directed. follow package directions     No current facility-administered medications on file prior to visit.    No Known Allergies  Family History  Problem Relation Age of Onset  . Thyroid disease Mother   . Other Mother     uterine prolapse  . Scoliosis Mother   . Thyroid disease Father   . Thyroid disease Brother   . COPD Maternal Grandmother   . Colon cancer Maternal Grandfather   . Scoliosis Sister   . Scoliosis Sister   . Other Daughter     papilledema  . Other Maternal Aunt     fungal meningitis, leukocytopenia    Social History   Social History  . Marital Status: Married    Spouse Name: N/A  . Number of Children: 2  . Years of Education: N/A   Occupational History  . Glass blower/designer     Social History Main  Topics  . Smoking status: Former Research scientist (life sciences)  . Smokeless tobacco: Never Used  . Alcohol Use: Yes     Comment: occasional  . Drug Use: No  . Sexual Activity: Not Asked     Comment: lives with husband and daughters, no dietary restrictions   Other Topics Concern  . None   Social History Narrative   Review of Systems - See HPI.  All other ROS are negative.  BP 104/72 mmHg  Pulse 60  Temp(Src) 98.4 F (36.9 C) (Oral)  Resp 16  Ht 5\' 3"  (1.6 m)  Wt 100 lb (45.36 kg)  BMI 17.72 kg/m2  SpO2 100%  LMP 04/26/2015  Physical Exam  Constitutional: She is oriented to person, place, and time and well-developed, well-nourished, and in no distress.  HENT:  Head:  Normocephalic and atraumatic.  Cardiovascular: Normal rate, regular rhythm, normal heart sounds and intact distal pulses.   Pulmonary/Chest: Effort normal and breath sounds normal. No respiratory distress. She has no wheezes. She has no rales. She exhibits no tenderness.  Abdominal: Soft. Bowel sounds are normal.    Neurological: She is alert and oriented to person, place, and time.  Skin: Skin is warm and dry. No rash noted.  Psychiatric: Affect normal.  Vitals reviewed.   Recent Results (from the past 2160 hour(s))  TSH     Status: None   Collection Time: 04/02/15  2:23 PM  Result Value Ref Range   TSH 2.56 0.35 - 4.50 uIU/mL  T4, free     Status: None   Collection Time: 04/02/15  2:23 PM  Result Value Ref Range   Free T4 0.66 0.60 - 1.60 ng/dL    Assessment/Plan: Umbilical pain No palpable hernia. No current constipation or hardened stools. Suspect muscular strain. Rx Mobic. Supportive measures reviewed. Follow-up next week. If not resolving, will need US abdomen.

## 2015-05-18 ENCOUNTER — Other Ambulatory Visit: Payer: Self-pay | Admitting: Family Medicine

## 2015-05-18 NOTE — Telephone Encounter (Signed)
Faxed hardcopy to [facility] in [place]. Called the patient left a detailed message of PCP instructions regarding refills.

## 2015-05-18 NOTE — Telephone Encounter (Signed)
I am willing to refill her Alprazolam with same strength, same sig, same number and 1 rf but remind her to keep meds like this active in chart has to be seen every 6 months so needs an appt with me to have any further refills

## 2015-05-18 NOTE — Telephone Encounter (Signed)
Requesting:alprazolam Contract  None UDS   None Last OV   12/29/14 Last Refill   #5 with 1 refill on 12/29/14  Please Advise

## 2015-06-18 ENCOUNTER — Ambulatory Visit: Payer: BLUE CROSS/BLUE SHIELD | Admitting: Internal Medicine

## 2015-08-07 ENCOUNTER — Other Ambulatory Visit: Payer: Self-pay | Admitting: Internal Medicine

## 2015-08-13 ENCOUNTER — Encounter: Payer: Self-pay | Admitting: Internal Medicine

## 2015-08-13 ENCOUNTER — Ambulatory Visit (INDEPENDENT_AMBULATORY_CARE_PROVIDER_SITE_OTHER): Payer: BLUE CROSS/BLUE SHIELD | Admitting: Internal Medicine

## 2015-08-13 VITALS — BP 90/60 | HR 60 | Ht 63.39 in | Wt 101.0 lb

## 2015-08-13 DIAGNOSIS — K5900 Constipation, unspecified: Secondary | ICD-10-CM

## 2015-08-13 DIAGNOSIS — R140 Abdominal distension (gaseous): Secondary | ICD-10-CM

## 2015-08-13 DIAGNOSIS — K5909 Other constipation: Secondary | ICD-10-CM

## 2015-08-13 NOTE — Progress Notes (Signed)
   Subjective:    Patient ID: ANALIS YELINEK, female    DOB: June 12, 1976, 40 y.o.   MRN: [id number]  HPI Makahla Jobin is a 40 year old female with history of chronic constipation who is seen in follow-up. She was initially seen on 03/05/2015 in consultation from Dr. Charlett Blake. At that visit she was started on Linzess 145 g daily and she called several weeks later and had incomplete response. The dose was increased to 290 g and she follows up today. She reports Linzess has helped tremendously. She's having more regular bowel movements, less abdominal bloating and resolution totally of abdominal pain. She's having a bowel movement 4-5 days per week. On days that she doesn't have a bowel movement she is noticing some lower abdominal bloating but no pain. Rectal bleeding or melena. Good appetite.  In November she did develop periumbilical abdominal pain and was seen by primary care. She was felt to have a muscle strain. This pain resolved completely.  No recent issues with reflux or heartburn.  Review of Systems As per history of present illness, otherwise negative  Current Medications, Allergies, Past Medical History, Past Surgical History, Family History and Social History were reviewed in [facility] record.     Objective:   Physical Exam BP 90/60 mmHg  Pulse 60  Ht 5' 3.39" (1.61 m)  Wt 101 lb (45.813 kg)  BMI 17.67 kg/m2  LMP 08/09/2015 Constitutional: Well-developed and well-nourished. No distress. HEENT: Normocephalic and atraumatic.Conjunctivae are normal.  No scleral icterus. Neck: Neck supple. Trachea midline. Cardiovascular: Normal rate, regular rhythm and intact distal pulses. No M/R/G Pulmonary/chest: Effort normal and breath sounds normal. No wheezing, rales or rhonchi. Abdominal: Soft, nontender, nondistended. Bowel sounds active throughout. There are no masses palpable. No hepatosplenomegaly. Extremities: no clubbing, cyanosis, or  edema Neurological: Alert and oriented to person place and time. Skin: Skin is warm and dry.  Psychiatric: Normal mood and affect. Behavior is normal.      Assessment & Plan:  40 year old female with history of chronic constipation who is seen in follow-up.  1. Chronic constipation with abdominal bloating and pain -- excellent response to Linzess. We'll continue to 90 g daily. She's having some bloating on days she's not having a bowel movement but I told her even with Linzess many patients do not go every single day. Will try her on VSL 3 2 caps daily 1 month then 1 cap daily thereafter. 3 month trial. Call in 8 weeks to let me know if it helped with bloating. If it did, it can be continued, if not discontinue.  One year follow-up, sooner if necessary

## 2015-08-13 NOTE — Patient Instructions (Signed)
Continue Linzess.  Please take VSL #3, 2 capsules daily x 1 month, then 1 capsule daily thereafter.   Please follow up with Dr Hilarie Fredrickson in 1 year.  Call our office to let us know how the VSL is working for you.

## 2015-09-01 ENCOUNTER — Encounter: Payer: Self-pay | Admitting: Internal Medicine

## 2015-09-01 NOTE — Telephone Encounter (Signed)
Noted  

## 2016-01-31 ENCOUNTER — Ambulatory Visit (INDEPENDENT_AMBULATORY_CARE_PROVIDER_SITE_OTHER): Payer: PRIVATE HEALTH INSURANCE | Admitting: Family Medicine

## 2016-01-31 ENCOUNTER — Encounter: Payer: Self-pay | Admitting: Family Medicine

## 2016-01-31 VITALS — BP 102/64 | HR 59 | Temp 98.4°F | Ht 63.0 in | Wt 103.1 lb

## 2016-01-31 DIAGNOSIS — K219 Gastro-esophageal reflux disease without esophagitis: Secondary | ICD-10-CM

## 2016-01-31 DIAGNOSIS — K5900 Constipation, unspecified: Secondary | ICD-10-CM

## 2016-01-31 DIAGNOSIS — Z0000 Encounter for general adult medical examination without abnormal findings: Secondary | ICD-10-CM | POA: Diagnosis not present

## 2016-01-31 DIAGNOSIS — T7840XD Allergy, unspecified, subsequent encounter: Secondary | ICD-10-CM

## 2016-01-31 DIAGNOSIS — D72819 Decreased white blood cell count, unspecified: Secondary | ICD-10-CM

## 2016-01-31 DIAGNOSIS — R7989 Other specified abnormal findings of blood chemistry: Secondary | ICD-10-CM

## 2016-01-31 DIAGNOSIS — E782 Mixed hyperlipidemia: Secondary | ICD-10-CM

## 2016-01-31 LAB — COMPREHENSIVE METABOLIC PANEL
ALBUMIN: 4.5 g/dL (ref 3.5–5.2)
ALK PHOS: 43 U/L (ref 39–117)
ALT: 14 U/L (ref 0–35)
AST: 20 U/L (ref 0–37)
BILIRUBIN TOTAL: 0.6 mg/dL (ref 0.2–1.2)
BUN: 6 mg/dL (ref 6–23)
CALCIUM: 9.5 mg/dL (ref 8.4–10.5)
CO2: 30 meq/L (ref 19–32)
CREATININE: 0.77 mg/dL (ref 0.40–1.20)
Chloride: 104 mEq/L (ref 96–112)
GFR: 88.87 mL/min (ref >60.00)
Glucose, Bld: 91 mg/dL (ref 70–99)
Potassium: 3.9 mEq/L (ref 3.5–5.1)
Sodium: 140 mEq/L (ref 135–145)
TOTAL PROTEIN: 7 g/dL (ref 6.0–8.3)

## 2016-01-31 LAB — CBC
HCT: 37.2 % (ref 36.0–46.0)
Hemoglobin: 12.4 g/dL (ref 12.0–15.0)
MCHC: 33.5 g/dL (ref 30.0–36.0)
MCV: 93.1 fl (ref 78.0–100.0)
Platelets: 252 10*3/uL (ref 150.0–400.0)
RBC: 3.99 Mil/uL (ref 3.87–5.11)
RDW: 13.9 % (ref 11.5–15.5)
WBC: 5.7 10*3/uL (ref 4.0–10.5)

## 2016-01-31 LAB — TSH: TSH: 4.35 u[IU]/mL (ref 0.35–4.50)

## 2016-01-31 LAB — T4, FREE: Free T4: 0.48 ng/dL — ABNORMAL LOW (ref 0.60–1.60)

## 2016-01-31 LAB — LIPID PANEL
CHOL/HDL RATIO: 2
CHOLESTEROL: 204 mg/dL — AB (ref 0–200)
HDL: 109.1 mg/dL (ref >39.00)
LDL Cholesterol: 81 mg/dL (ref 0–99)
NonHDL: 95.12
TRIGLYCERIDES: 71 mg/dL (ref 0.0–149.0)
VLDL: 14.2 mg/dL (ref 0.0–40.0)

## 2016-01-31 MED ORDER — ALPRAZOLAM 0.25 MG PO TABS: 0.2500 mg | ORAL_TABLET | Freq: Two times a day (BID) | ORAL | 1 refills | Status: DC | PRN

## 2016-01-31 NOTE — Progress Notes (Signed)
Pre visit review using our clinic review tool, if applicable. No additional management support is needed unless otherwise documented below in the visit note. 

## 2016-01-31 NOTE — Patient Instructions (Signed)
NOW probiotics 10 strain cap 1 cap daily [facility]  [email or web address]  Preventive Care for Adults, Female A healthy lifestyle and preventive care can promote health and wellness. Preventive health guidelines for women include the following key practices.  A routine yearly physical is a good way to check with your health care provider about your health and preventive screening. It is a chance to share any concerns and updates on your health and to receive a thorough exam.  Visit your dentist for a routine exam and preventive care every 6 months. Brush your teeth twice a day and floss once a day. Good oral hygiene prevents tooth decay and gum disease.  The frequency of eye exams is based on your age, health, family medical history, use of contact lenses, and other factors. Follow your health care provider's recommendations for frequency of eye exams.  Eat a healthy diet. Foods like vegetables, fruits, whole grains, low-fat dairy products, and lean protein foods contain the nutrients you need without too many calories. Decrease your intake of foods high in solid fats, added sugars, and salt. Eat the right amount of calories for you.Get information about a proper diet from your health care provider, if necessary.  Regular physical exercise is one of the most important things you can do for your health. Most adults should get at least 150 minutes of moderate-intensity exercise (any activity that increases your heart rate and causes you to sweat) each week. In addition, most adults need muscle-strengthening exercises on 2 or more days a week.  Maintain a healthy weight. The body mass index (BMI) is a screening tool to identify possible weight problems. It provides an estimate of body fat based on height and weight. Your health care provider can find your BMI and can help you achieve or maintain a healthy weight.For adults 20 years and older:  A BMI below 18.5 is considered underweight.  A  BMI of 18.5 to 24.9 is normal.  A BMI of 25 to 29.9 is considered overweight.  A BMI of 30 and above is considered obese.  Maintain normal blood lipids and cholesterol levels by exercising and minimizing your intake of saturated fat. Eat a balanced diet with plenty of fruit and vegetables. Blood tests for lipids and cholesterol should begin at age 38 and be repeated every 5 years. If your lipid or cholesterol levels are high, you are over 50, or you are at high risk for heart disease, you may need your cholesterol levels checked more frequently.Ongoing high lipid and cholesterol levels should be treated with medicines if diet and exercise are not working.  If you smoke, find out from your health care provider how to quit. If you do not use tobacco, do not start.  Lung cancer screening is recommended for adults aged 28-80 years who are at high risk for developing lung cancer because of a history of smoking. A yearly low-dose CT scan of the lungs is recommended for people who have at least a 30-pack-year history of smoking and are a current smoker or have quit within the past 15 years. A pack year of smoking is smoking an average of 1 pack of cigarettes a day for 1 year (for example: 1 pack a day for 30 years or 2 packs a day for 15 years). Yearly screening should continue until the smoker has stopped smoking for at least 15 years. Yearly screening should be stopped for people who develop a health problem that would prevent them from having lung  cancer treatment.  If you are pregnant, do not drink alcohol. If you are breastfeeding, be very cautious about drinking alcohol. If you are not pregnant and choose to drink alcohol, do not have more than 1 drink per day. One drink is considered to be 12 ounces (355 mL) of beer, 5 ounces (148 mL) of wine, or 1.5 ounces (44 mL) of liquor.  Avoid use of street drugs. Do not share needles with anyone. Ask for help if you need support or instructions about stopping  the use of drugs.  High blood pressure causes heart disease and increases the risk of stroke. Your blood pressure should be checked at least every 1 to 2 years. Ongoing high blood pressure should be treated with medicines if weight loss and exercise do not work.  If you are 60-46 years old, ask your health care provider if you should take aspirin to prevent strokes.  Diabetes screening is done by taking a blood sample to check your blood glucose level after you have not eaten for a certain period of time (fasting). If you are not overweight and you do not have risk factors for diabetes, you should be screened once every 3 years starting at age 78. If you are overweight or obese and you are 75-24 years of age, you should be screened for diabetes every year as part of your cardiovascular risk assessment.  Breast cancer screening is essential preventive care for women. You should practice "breast self-awareness." This means understanding the normal appearance and feel of your breasts and may include breast self-examination. Any changes detected, no matter how small, should be reported to a health care provider. Women in their 64s and 30s should have a clinical breast exam (CBE) by a health care provider as part of a regular health exam every 1 to 3 years. After age 38, women should have a CBE every year. Starting at age 35, women should consider having a mammogram (breast X-ray test) every year. Women who have a family history of breast cancer should talk to their health care provider about genetic screening. Women at a high risk of breast cancer should talk to their health care providers about having an MRI and a mammogram every year.  Breast cancer gene (BRCA)-related cancer risk assessment is recommended for women who have family members with BRCA-related cancers. BRCA-related cancers include breast, ovarian, tubal, and peritoneal cancers. Having family members with these cancers may be associated with an  increased risk for harmful changes (mutations) in the breast cancer genes BRCA1 and BRCA2. Results of the assessment will determine the need for genetic counseling and BRCA1 and BRCA2 testing.  Your health care provider may recommend that you be screened regularly for cancer of the pelvic organs (ovaries, uterus, and vagina). This screening involves a pelvic examination, including checking for microscopic changes to the surface of your cervix (Pap test). You may be encouraged to have this screening done every 3 years, beginning at age 73.  For women ages 21-65, health care providers may recommend pelvic exams and Pap testing every 3 years, or they may recommend the Pap and pelvic exam, combined with testing for human papilloma virus (HPV), every 5 years. Some types of HPV increase your risk of cervical cancer. Testing for HPV may also be done on women of any age with unclear Pap test results.  Other health care providers may not recommend any screening for nonpregnant women who are considered low risk for pelvic cancer and who do not have  symptoms. Ask your health care provider if a screening pelvic exam is right for you.  If you have had past treatment for cervical cancer or a condition that could lead to cancer, you need Pap tests and screening for cancer for at least 20 years after your treatment. If Pap tests have been discontinued, your risk factors (such as having a new sexual partner) need to be reassessed to determine if screening should resume. Some women have medical problems that increase the chance of getting cervical cancer. In these cases, your health care provider may recommend more frequent screening and Pap tests.  Colorectal cancer can be detected and often prevented. Most routine colorectal cancer screening begins at the age of 10 years and continues through age 44 years. However, your health care provider may recommend screening at an earlier age if you have risk factors for colon  cancer. On a yearly basis, your health care provider may provide home test kits to check for hidden blood in the stool. Use of a small camera at the end of a tube, to directly examine the colon (sigmoidoscopy or colonoscopy), can detect the earliest forms of colorectal cancer. Talk to your health care provider about this at age 77, when routine screening begins. Direct exam of the colon should be repeated every 5-10 years through age 57 years, unless early forms of precancerous polyps or small growths are found.  People who are at an increased risk for hepatitis B should be screened for this virus. You are considered at high risk for hepatitis B if:  You were born in a country where hepatitis B occurs often. Talk with your health care provider about which countries are considered high risk.  Your parents were born in a high-risk country and you have not received a shot to protect against hepatitis B (hepatitis B vaccine).  You have HIV or AIDS.  You use needles to inject street drugs.  You live with, or have sex with, someone who has hepatitis B.  You get hemodialysis treatment.  You take certain medicines for conditions like cancer, organ transplantation, and autoimmune conditions.  Hepatitis C blood testing is recommended for all people born from [birth year] through 1964 and any individual with known risks for hepatitis C.  Practice safe sex. Use condoms and avoid high-risk sexual practices to reduce the spread of sexually transmitted infections (STIs). STIs include gonorrhea, chlamydia, syphilis, trichomonas, herpes, HPV, and human immunodeficiency virus (HIV). Herpes, HIV, and HPV are viral illnesses that have no cure. They can result in disability, cancer, and death.  You should be screened for sexually transmitted illnesses (STIs) including gonorrhea and chlamydia if:  You are sexually active and are younger than 24 years.  You are older than 24 years and your health care provider tells you  that you are at risk for this type of infection.  Your sexual activity has changed since you were last screened and you are at an increased risk for chlamydia or gonorrhea. Ask your health care provider if you are at risk.  If you are at risk of being infected with HIV, it is recommended that you take a prescription medicine daily to prevent HIV infection. This is called preexposure prophylaxis (PrEP). You are considered at risk if:  You are sexually active and do not regularly use condoms or know the HIV status of your partner(s).  You take drugs by injection.  You are sexually active with a partner who has HIV.  Talk with your health care  provider about whether you are at high risk of being infected with HIV. If you choose to begin PrEP, you should first be tested for HIV. You should then be tested every 3 months for as long as you are taking PrEP.  Osteoporosis is a disease in which the bones lose minerals and strength with aging. This can result in serious bone fractures or breaks. The risk of osteoporosis can be identified using a bone density scan. Women ages 35 years and over and women at risk for fractures or osteoporosis should discuss screening with their health care providers. Ask your health care provider whether you should take a calcium supplement or vitamin D to reduce the rate of osteoporosis.  Menopause can be associated with physical symptoms and risks. Hormone replacement therapy is available to decrease symptoms and risks. You should talk to your health care provider about whether hormone replacement therapy is right for you.  Use sunscreen. Apply sunscreen liberally and repeatedly throughout the day. You should seek shade when your shadow is shorter than you. Protect yourself by wearing long sleeves, pants, a wide-brimmed hat, and sunglasses year round, whenever you are outdoors.  Once a month, do a whole body skin exam, using a mirror to look at the skin on your back. Tell  your health care provider of new moles, moles that have irregular borders, moles that are larger than a pencil eraser, or moles that have changed in shape or color.  Stay current with required vaccines (immunizations).  Influenza vaccine. All adults should be immunized every year.  Tetanus, diphtheria, and acellular pertussis (Td, Tdap) vaccine. Pregnant women should receive 1 dose of Tdap vaccine during each pregnancy. The dose should be obtained regardless of the length of time since the last dose. Immunization is preferred during the 27th-36th week of gestation. An adult who has not previously received Tdap or who does not know her vaccine status should receive 1 dose of Tdap. This initial dose should be followed by tetanus and diphtheria toxoids (Td) booster doses every 10 years. Adults with an unknown or incomplete history of completing a 3-dose immunization series with Td-containing vaccines should begin or complete a primary immunization series including a Tdap dose. Adults should receive a Td booster every 10 years.  Varicella vaccine. An adult without evidence of immunity to varicella should receive 2 doses or a second dose if she has previously received 1 dose. Pregnant females who do not have evidence of immunity should receive the first dose after pregnancy. This first dose should be obtained before leaving the health care facility. The second dose should be obtained 4-8 weeks after the first dose.  Human papillomavirus (HPV) vaccine. Females aged 13-26 years who have not received the vaccine previously should obtain the 3-dose series. The vaccine is not recommended for use in pregnant females. However, pregnancy testing is not needed before receiving a dose. If a female is found to be pregnant after receiving a dose, no treatment is needed. In that case, the remaining doses should be delayed until after the pregnancy. Immunization is recommended for any person with an immunocompromised  condition through the age of 26 years if she did not get any or all doses earlier. During the 3-dose series, the second dose should be obtained 4-8 weeks after the first dose. The third dose should be obtained 24 weeks after the first dose and 16 weeks after the second dose.  Zoster vaccine. One dose is recommended for adults aged 57 years or older  unless certain conditions are present.  Measles, mumps, and rubella (MMR) vaccine. Adults born before [birth year] generally are considered immune to measles and mumps. Adults born in [birth year] or later should have 1 or more doses of MMR vaccine unless there is a contraindication to the vaccine or there is laboratory evidence of immunity to each of the three diseases. A routine second dose of MMR vaccine should be obtained at least 28 days after the first dose for students attending postsecondary schools, health care workers, or international travelers. People who received inactivated measles vaccine or an unknown type of measles vaccine during 1963-1967 should receive 2 doses of MMR vaccine. People who received inactivated mumps vaccine or an unknown type of mumps vaccine before 1979 and are at high risk for mumps infection should consider immunization with 2 doses of MMR vaccine. For females of childbearing age, rubella immunity should be determined. If there is no evidence of immunity, females who are not pregnant should be vaccinated. If there is no evidence of immunity, females who are pregnant should delay immunization until after pregnancy. Unvaccinated health care workers born before [birth year] who lack laboratory evidence of measles, mumps, or rubella immunity or laboratory confirmation of disease should consider measles and mumps immunization with 2 doses of MMR vaccine or rubella immunization with 1 dose of MMR vaccine.  Pneumococcal 13-valent conjugate (PCV13) vaccine. When indicated, a person who is uncertain of his immunization history and has no record of immunization  should receive the PCV13 vaccine. All adults 22 years of age and older should receive this vaccine. An adult aged 87 years or older who has certain medical conditions and has not been previously immunized should receive 1 dose of PCV13 vaccine. This PCV13 should be followed with a dose of pneumococcal polysaccharide (PPSV23) vaccine. Adults who are at high risk for pneumococcal disease should obtain the PPSV23 vaccine at least 8 weeks after the dose of PCV13 vaccine. Adults older than 39 years of age who have normal immune system function should obtain the PPSV23 vaccine dose at least 1 year after the dose of PCV13 vaccine.  Pneumococcal polysaccharide (PPSV23) vaccine. When PCV13 is also indicated, PCV13 should be obtained first. All adults aged 31 years and older should be immunized. An adult younger than age 11 years who has certain medical conditions should be immunized. Any person who resides in a nursing home or long-term care facility should be immunized. An adult smoker should be immunized. People with an immunocompromised condition and certain other conditions should receive both PCV13 and PPSV23 vaccines. People with human immunodeficiency virus (HIV) infection should be immunized as soon as possible after diagnosis. Immunization during chemotherapy or radiation therapy should be avoided. Routine use of PPSV23 vaccine is not recommended for American Indians, [place] Natives, or people younger than 65 years unless there are medical conditions that require PPSV23 vaccine. When indicated, people who have unknown immunization and have no record of immunization should receive PPSV23 vaccine. One-time revaccination 5 years after the first dose of PPSV23 is recommended for people aged 19-64 years who have chronic kidney failure, nephrotic syndrome, asplenia, or immunocompromised conditions. People who received 1-2 doses of PPSV23 before age 38 years should receive another dose of PPSV23 vaccine at age 77 years  or later if at least 5 years have passed since the previous dose. Doses of PPSV23 are not needed for people immunized with PPSV23 at or after age 45 years.  Meningococcal vaccine. Adults with asplenia or persistent complement component deficiencies should  receive 2 doses of quadrivalent meningococcal conjugate (MenACWY-D) vaccine. The doses should be obtained at least 2 months apart. Microbiologists working with certain meningococcal bacteria, [place] recruits, people at risk during an outbreak, and people who travel to or live in countries with a high rate of meningitis should be immunized. A first-year college student up through age 15 years who is living in a residence hall should receive a dose if she did not receive a dose on or after her 16th birthday. Adults who have certain high-risk conditions should receive one or more doses of vaccine.  Hepatitis A vaccine. Adults who wish to be protected from this disease, have certain high-risk conditions, work with hepatitis A-infected animals, work in hepatitis A research labs, or travel to or work in countries with a high rate of hepatitis A should be immunized. Adults who were previously unvaccinated and who anticipate close contact with an international adoptee during the first 60 days after arrival in the [place] from a country with a high rate of hepatitis A should be immunized.  Hepatitis B vaccine. Adults who wish to be protected from this disease, have certain high-risk conditions, may be exposed to blood or other infectious body fluids, are household contacts or sex partners of hepatitis B positive people, are clients or workers in certain care facilities, or travel to or work in countries with a high rate of hepatitis B should be immunized.  Haemophilus influenzae type b (Hib) vaccine. A previously unvaccinated person with asplenia or sickle cell disease or having a scheduled splenectomy should receive 1 dose of Hib vaccine. Regardless of  previous immunization, a recipient of a hematopoietic stem cell transplant should receive a 3-dose series 6-12 months after her successful transplant. Hib vaccine is not recommended for adults with HIV infection. Preventive Services / Frequency Ages 57 to 52 years  Blood pressure check.** / Every 3-5 years.  Lipid and cholesterol check.** / Every 5 years beginning at age 12.  Clinical breast exam.** / Every 3 years for women in their 48s and 60s.  BRCA-related cancer risk assessment.** / For women who have family members with a BRCA-related cancer (breast, ovarian, tubal, or peritoneal cancers).  Pap test.** / Every 2 years from ages 37 through 24. Every 3 years starting at age 8 through age 48 or 14 with a history of 3 consecutive normal Pap tests.  HPV screening.** / Every 3 years from ages 76 through ages 74 to 34 with a history of 3 consecutive normal Pap tests.  Hepatitis C blood test.** / For any individual with known risks for hepatitis C.  Skin self-exam. / Monthly.  Influenza vaccine. / Every year.  Tetanus, diphtheria, and acellular pertussis (Tdap, Td) vaccine.** / Consult your health care provider. Pregnant women should receive 1 dose of Tdap vaccine during each pregnancy. 1 dose of Td every 10 years.  Varicella vaccine.** / Consult your health care provider. Pregnant females who do not have evidence of immunity should receive the first dose after pregnancy.  HPV vaccine. / 3 doses over 6 months, if 58 and younger. The vaccine is not recommended for use in pregnant females. However, pregnancy testing is not needed before receiving a dose.  Measles, mumps, rubella (MMR) vaccine.** / You need at least 1 dose of MMR if you were born in 1957 or later. You may also need a 2nd dose. For females of childbearing age, rubella immunity should be determined. If there is no evidence of immunity, females who are not  pregnant should be vaccinated. If there is no evidence of immunity,  females who are pregnant should delay immunization until after pregnancy.  Pneumococcal 13-valent conjugate (PCV13) vaccine.** / Consult your health care provider.  Pneumococcal polysaccharide (PPSV23) vaccine.** / 1 to 2 doses if you smoke cigarettes or if you have certain conditions.  Meningococcal vaccine.** / 1 dose if you are age 74 to 8 years and a Market researcher living in a residence hall, or have one of several medical conditions, you need to get vaccinated against meningococcal disease. You may also need additional booster doses.  Hepatitis A vaccine.** / Consult your health care provider.  Hepatitis B vaccine.** / Consult your health care provider.  Haemophilus influenzae type b (Hib) vaccine.** / Consult your health care provider. Ages 34 to 68 years  Blood pressure check.** / Every year.  Lipid and cholesterol check.** / Every 5 years beginning at age 56 years.  Lung cancer screening. / Every year if you are aged 88-80 years and have a 30-pack-year history of smoking and currently smoke or have quit within the past 15 years. Yearly screening is stopped once you have quit smoking for at least 15 years or develop a health problem that would prevent you from having lung cancer treatment.  Clinical breast exam.** / Every year after age 65 years.  BRCA-related cancer risk assessment.** / For women who have family members with a BRCA-related cancer (breast, ovarian, tubal, or peritoneal cancers).  Mammogram.** / Every year beginning at age 90 years and continuing for as long as you are in good health. Consult with your health care provider.  Pap test.** / Every 3 years starting at age 40 years through age 54 or 32 years with a history of 3 consecutive normal Pap tests.  HPV screening.** / Every 3 years from ages 77 years through ages 86 to 39 years with a history of 3 consecutive normal Pap tests.  Fecal occult blood test (FOBT) of stool. / Every year beginning at  age 41 years and continuing until age 31 years. You may not need to do this test if you get a colonoscopy every 10 years.  Flexible sigmoidoscopy or colonoscopy.** / Every 5 years for a flexible sigmoidoscopy or every 10 years for a colonoscopy beginning at age 38 years and continuing until age 32 years.  Hepatitis C blood test.** / For all people born from [birth year] through 1964 and any individual with known risks for hepatitis C.  Skin self-exam. / Monthly.  Influenza vaccine. / Every year.  Tetanus, diphtheria, and acellular pertussis (Tdap/Td) vaccine.** / Consult your health care provider. Pregnant women should receive 1 dose of Tdap vaccine during each pregnancy. 1 dose of Td every 10 years.  Varicella vaccine.** / Consult your health care provider. Pregnant females who do not have evidence of immunity should receive the first dose after pregnancy.  Zoster vaccine.** / 1 dose for adults aged 20 years or older.  Measles, mumps, rubella (MMR) vaccine.** / You need at least 1 dose of MMR if you were born in 1957 or later. You may also need a second dose. For females of childbearing age, rubella immunity should be determined. If there is no evidence of immunity, females who are not pregnant should be vaccinated. If there is no evidence of immunity, females who are pregnant should delay immunization until after pregnancy.  Pneumococcal 13-valent conjugate (PCV13) vaccine.** / Consult your health care provider.  Pneumococcal polysaccharide (PPSV23) vaccine.** / 1 to 2 doses  if you smoke cigarettes or if you have certain conditions.  Meningococcal vaccine.** / Consult your health care provider.  Hepatitis A vaccine.** / Consult your health care provider.  Hepatitis B vaccine.** / Consult your health care provider.  Haemophilus influenzae type b (Hib) vaccine.** / Consult your health care provider. Ages 58 years and over  Blood pressure check.** / Every year.  Lipid and cholesterol check.**  / Every 5 years beginning at age 20 years.  Lung cancer screening. / Every year if you are aged 69-80 years and have a 30-pack-year history of smoking and currently smoke or have quit within the past 15 years. Yearly screening is stopped once you have quit smoking for at least 15 years or develop a health problem that would prevent you from having lung cancer treatment.  Clinical breast exam.** / Every year after age 36 years.  BRCA-related cancer risk assessment.** / For women who have family members with a BRCA-related cancer (breast, ovarian, tubal, or peritoneal cancers).  Mammogram.** / Every year beginning at age 66 years and continuing for as long as you are in good health. Consult with your health care provider.  Pap test.** / Every 3 years starting at age 48 years through age 59 or 2 years with 3 consecutive normal Pap tests. Testing can be stopped between 65 and 70 years with 3 consecutive normal Pap tests and no abnormal Pap or HPV tests in the past 10 years.  HPV screening.** / Every 3 years from ages 68 years through ages 3 or 70 years with a history of 3 consecutive normal Pap tests. Testing can be stopped between 65 and 70 years with 3 consecutive normal Pap tests and no abnormal Pap or HPV tests in the past 10 years.  Fecal occult blood test (FOBT) of stool. / Every year beginning at age 30 years and continuing until age 67 years. You may not need to do this test if you get a colonoscopy every 10 years.  Flexible sigmoidoscopy or colonoscopy.** / Every 5 years for a flexible sigmoidoscopy or every 10 years for a colonoscopy beginning at age 11 years and continuing until age 73 years.  Hepatitis C blood test.** / For all people born from [birth year] through 1965 and any individual with known risks for hepatitis C.  Osteoporosis screening.** / A one-time screening for women ages 27 years and over and women at risk for fractures or osteoporosis.  Skin self-exam. / Monthly.  Influenza  vaccine. / Every year.  Tetanus, diphtheria, and acellular pertussis (Tdap/Td) vaccine.** / 1 dose of Td every 10 years.  Varicella vaccine.** / Consult your health care provider.  Zoster vaccine.** / 1 dose for adults aged 58 years or older.  Pneumococcal 13-valent conjugate (PCV13) vaccine.** / Consult your health care provider.  Pneumococcal polysaccharide (PPSV23) vaccine.** / 1 dose for all adults aged 34 years and older.  Meningococcal vaccine.** / Consult your health care provider.  Hepatitis A vaccine.** / Consult your health care provider.  Hepatitis B vaccine.** / Consult your health care provider.  Haemophilus influenzae type b (Hib) vaccine.** / Consult your health care provider. ** Family history and personal history of risk and conditions may change your health care provider's recommendations.   This information is not intended to replace advice given to you by your health care provider. Make sure you discuss any questions you have with your health care provider.   Document Released: 08/01/2001 Document Revised: 06/26/2014 Document Reviewed: 10/31/2010 Elsevier Interactive Patient Education 2016  [employer].

## 2016-02-01 ENCOUNTER — Other Ambulatory Visit: Payer: Self-pay | Admitting: Family Medicine

## 2016-02-01 DIAGNOSIS — R7989 Other specified abnormal findings of blood chemistry: Secondary | ICD-10-CM

## 2016-02-01 MED ORDER — LEVOTHYROXINE SODIUM 25 MCG PO TABS: 25.0000 ug | ORAL_TABLET | Freq: Every day | ORAL | 2 refills | Status: DC

## 2016-02-06 ENCOUNTER — Encounter: Payer: Self-pay | Admitting: Family Medicine

## 2016-02-06 DIAGNOSIS — E782 Mixed hyperlipidemia: Secondary | ICD-10-CM | POA: Insufficient documentation

## 2016-02-06 HISTORY — DX: Mixed hyperlipidemia: E78.2

## 2016-02-06 NOTE — Assessment & Plan Note (Signed)
Doing well, takes Alprazolam very infrequently

## 2016-02-06 NOTE — Assessment & Plan Note (Signed)
Encouraged heart healthy diet, increase exercise, avoid trans fats, consider a krill oil cap daily 

## 2016-02-06 NOTE — Assessment & Plan Note (Signed)
TSH suppressed but T4 normal will continue to monitor

## 2016-02-06 NOTE — Progress Notes (Signed)
Patient ID: Nancy Higgins, female   DOB: [birth year], 40 y.o.   MRN: [id number]   Subjective:    Patient ID: Nancy Higgins, female    DOB: 1975-09-12, 40 y.o.   MRN: [id number]  Chief Complaint  Patient presents with  . Annual Exam    HPI Patient is in today for annual preventative exam. She is doing well most days. Has infrequent heartburn which responds to Ranitidine. No recent illness or acute concerns. Denies CP/palp/SOB/HA/congestion/fevers/GI or GU c/o. Taking meds as prescribed    Past Surgical History:  Procedure Laterality Date  . CYSTOSCOPY W/ URETERAL STENT PLACEMENT     and removal  . LAPAROSCOPIC APPENDECTOMY  12/30/2011   Procedure: APPENDECTOMY LAPAROSCOPIC;  Surgeon: Edward Jolly, MD;  Location: [facility];  Service: General;  Laterality: N/A;  . LITHOTRIPSY     once during pregnancy    Family History  Problem Relation Age of Onset  . Thyroid disease Mother   . Other Mother     uterine prolapse  . Scoliosis Mother   . Thyroid disease Father   . Thyroid disease Brother   . COPD Maternal Grandmother   . Colon cancer Maternal Grandfather   . Scoliosis Sister   . Scoliosis Sister   . Other Daughter     papilledema  . Other Maternal Aunt     fungal meningitis, leukocytopenia    Social History   Social History  . Marital status: Married    Spouse name: N/A  . Number of children: 2  . Years of education: N/A   Occupational History  . Glass blower/designer     Social History Main Topics  . Smoking status: Former Research scientist (life sciences)  . Smokeless tobacco: Never Used  . Alcohol use Yes     Comment: occasional  . Drug use: No  . Sexual activity: Not on file     Comment: lives with husband and daughters, no dietary restrictions   Other Topics Concern  . Not on file   Social History Narrative  . No narrative on file    Outpatient Medications Prior to Visit  Medication Sig Dispense Refill  . docusate sodium (COLACE) 100 MG capsule Take 100 mg by mouth daily  as needed for mild constipation.    Marland Kitchen ibuprofen (ADVIL,MOTRIN) 200 MG tablet Take 400 mg by mouth every 6 (six) hours as needed. Patient took this medication for menstrual cramps.    . ALPRAZolam (XANAX) 0.25 MG tablet TAKE 1/2- 2 TABLETS BY MOUTH DAILY AS NEEDED 5 tablet 1  . LINZESS 290 MCG CAPS capsule TAKE 1 CAPSULE BY MOUTH DAILY 30 capsule 0   No facility-administered medications prior to visit.     No Known Allergies  Review of Systems  Constitutional: Negative for fever and malaise/fatigue.  HENT: Negative for congestion.   Eyes: Negative for blurred vision.  Respiratory: Negative for shortness of breath.   Cardiovascular: Negative for chest pain, palpitations and leg swelling.  Gastrointestinal: Positive for heartburn. Negative for abdominal pain, blood in stool and nausea.  Genitourinary: Negative for dysuria and frequency.  Musculoskeletal: Negative for falls.  Skin: Negative for rash.  Neurological: Negative for dizziness, loss of consciousness and headaches.  Endo/Heme/Allergies: Negative for environmental allergies.  Psychiatric/Behavioral: Negative for depression. The patient is not nervous/anxious.        Objective:    Physical Exam  Constitutional: She is oriented to person, place, and time. She appears well-developed and well-nourished. No distress.  HENT:  Head: Normocephalic  and atraumatic.  Eyes: Conjunctivae are normal.  Neck: Neck supple. No thyromegaly present.  Cardiovascular: Normal rate, regular rhythm and normal heart sounds.   No murmur heard. Pulmonary/Chest: Effort normal and breath sounds normal. No respiratory distress.  Abdominal: Soft. Bowel sounds are normal. She exhibits no distension and no mass. There is no tenderness.  Musculoskeletal: She exhibits no edema.  Lymphadenopathy:    She has no cervical adenopathy.  Neurological: She is alert and oriented to person, place, and time.  Skin: Skin is warm and dry.  Psychiatric: She has a  normal mood and affect. Her behavior is normal.    BP 102/64 (BP Location: Left Arm, Patient Position: Sitting, Cuff Size: Normal)   Pulse (!) 59   Temp 98.4 F (36.9 C) (Oral)   Ht 5\' 3"  (1.6 m)   Wt 103 lb 2 oz (46.8 kg)   SpO2 98%   BMI 18.27 kg/m  Wt Readings from Last 3 Encounters:  01/31/16 103 lb 2 oz (46.8 kg)  08/13/15 101 lb (45.8 kg)  04/30/15 100 lb (45.4 kg)     Lab Results  Component Value Date   WBC 5.7 01/31/2016   HGB 12.4 01/31/2016   HCT 37.2 01/31/2016   PLT 252.0 01/31/2016   GLUCOSE 91 01/31/2016   CHOL 204 (H) 01/31/2016   TRIG 71.0 01/31/2016   HDL 109.10 01/31/2016   LDLCALC 81 01/31/2016   ALT 14 01/31/2016   AST 20 01/31/2016   NA 140 01/31/2016   K 3.9 01/31/2016   CL 104 01/31/2016   CREATININE 0.77 01/31/2016   BUN 6 01/31/2016   CO2 30 01/31/2016   TSH 4.35 01/31/2016    Lab Results  Component Value Date   TSH 4.35 01/31/2016   Lab Results  Component Value Date   WBC 5.7 01/31/2016   HGB 12.4 01/31/2016   HCT 37.2 01/31/2016   MCV 93.1 01/31/2016   PLT 252.0 01/31/2016   Lab Results  Component Value Date   NA 140 01/31/2016   K 3.9 01/31/2016   CO2 30 01/31/2016   GLUCOSE 91 01/31/2016   BUN 6 01/31/2016   CREATININE 0.77 01/31/2016   BILITOT 0.6 01/31/2016   ALKPHOS 43 01/31/2016   AST 20 01/31/2016   ALT 14 01/31/2016   PROT 7.0 01/31/2016   ALBUMIN 4.5 01/31/2016   CALCIUM 9.5 01/31/2016   GFR 88.87 01/31/2016   Lab Results  Component Value Date   CHOL 204 (H) 01/31/2016   Lab Results  Component Value Date   HDL 109.10 01/31/2016   Lab Results  Component Value Date   LDLCALC 81 01/31/2016   Lab Results  Component Value Date   TRIG 71.0 01/31/2016   Lab Results  Component Value Date   CHOLHDL 2 01/31/2016   No results found for: HGBA1C     Assessment & Plan:   Problem List Items Addressed This Visit    GERD (gastroesophageal reflux disease)    Avoid offending foods, take probiotics. Do  not eat large meals in late evening and consider raising head of bed.       Relevant Orders   TSH (Completed)   CBC (Completed)   Allergic state    Doing well, takes Alprazolam very infrequently      Constipation   Preventative health care - Primary    Patient encouraged to maintain heart healthy diet, regular exercise, adequate sleep. Consider daily probiotics. Take medications as prescribed. Given and reviewed copy of ACP documents  from [employer] and encouraged to complete and return      Relevant Orders   TSH (Completed)   CBC (Completed)   Lipid panel (Completed)   Comprehensive metabolic panel (Completed)   Leukopenia   Relevant Orders   CBC (Completed)   Abnormal TSH    TSH suppressed but T4 normal will continue to monitor      Relevant Orders   TSH (Completed)   T4, free (Completed)   Hyperlipidemia, mixed    Encouraged heart healthy diet, increase exercise, avoid trans fats, consider a krill oil cap daily       Other Visit Diagnoses   None.     I have discontinued Ms. Paz's LINZESS. I have also changed her ALPRAZolam. Additionally, I am having her maintain her ibuprofen and docusate sodium.  Meds ordered this encounter  Medications  . ALPRAZolam (XANAX) 0.25 MG tablet    Sig: Take 1 tablet (0.25 mg total) by mouth 2 (two) times daily as needed for anxiety.    Dispense:  5 tablet    Refill:  1     Arvo Ealy, MD

## 2016-02-06 NOTE — Assessment & Plan Note (Signed)
Patient encouraged to maintain heart healthy diet, regular exercise, adequate sleep. Consider daily probiotics. Take medications as prescribed. Given and reviewed copy of ACP documents from [place] Secretary of State and encouraged to complete and return 

## 2016-02-06 NOTE — Assessment & Plan Note (Signed)
Avoid offending foods, take probiotics. Do not eat large meals in late evening and consider raising head of bed.  

## 2016-04-28 ENCOUNTER — Other Ambulatory Visit: Payer: Self-pay | Admitting: Family Medicine

## 2016-05-01 ENCOUNTER — Other Ambulatory Visit (INDEPENDENT_AMBULATORY_CARE_PROVIDER_SITE_OTHER): Payer: PRIVATE HEALTH INSURANCE

## 2016-05-01 DIAGNOSIS — R946 Abnormal results of thyroid function studies: Secondary | ICD-10-CM

## 2016-05-01 DIAGNOSIS — R7989 Other specified abnormal findings of blood chemistry: Secondary | ICD-10-CM

## 2016-05-01 LAB — T4, FREE: FREE T4: 0.7 ng/dL (ref 0.60–1.60)

## 2016-05-01 LAB — TSH: TSH: 2.21 u[IU]/mL (ref 0.35–4.50)

## 2016-05-16 ENCOUNTER — Encounter: Payer: Self-pay | Admitting: Family Medicine

## 2016-05-16 ENCOUNTER — Other Ambulatory Visit: Payer: Self-pay | Admitting: Family Medicine

## 2016-05-16 DIAGNOSIS — E039 Hypothyroidism, unspecified: Secondary | ICD-10-CM

## 2016-05-29 ENCOUNTER — Other Ambulatory Visit: Payer: Self-pay | Admitting: Family Medicine

## 2016-09-08 ENCOUNTER — Other Ambulatory Visit (INDEPENDENT_AMBULATORY_CARE_PROVIDER_SITE_OTHER): Payer: No Typology Code available for payment source

## 2016-09-08 DIAGNOSIS — E039 Hypothyroidism, unspecified: Secondary | ICD-10-CM | POA: Diagnosis not present

## 2016-09-08 LAB — TSH: TSH: 1.89 u[IU]/mL (ref 0.35–4.50)

## 2016-10-03 ENCOUNTER — Other Ambulatory Visit: Payer: Self-pay | Admitting: Family Medicine

## 2017-02-01 ENCOUNTER — Encounter: Payer: Self-pay | Admitting: Family Medicine

## 2017-02-01 ENCOUNTER — Ambulatory Visit (INDEPENDENT_AMBULATORY_CARE_PROVIDER_SITE_OTHER): Payer: No Typology Code available for payment source | Admitting: Family Medicine

## 2017-02-01 VITALS — BP 90/56 | HR 66 | Temp 98.4°F | Resp 18 | Ht 63.0 in | Wt 100.8 lb

## 2017-02-01 DIAGNOSIS — K5900 Constipation, unspecified: Secondary | ICD-10-CM | POA: Diagnosis not present

## 2017-02-01 DIAGNOSIS — Z0001 Encounter for general adult medical examination with abnormal findings: Secondary | ICD-10-CM

## 2017-02-01 DIAGNOSIS — R946 Abnormal results of thyroid function studies: Secondary | ICD-10-CM

## 2017-02-01 DIAGNOSIS — Z0000 Encounter for general adult medical examination without abnormal findings: Secondary | ICD-10-CM

## 2017-02-01 DIAGNOSIS — R7989 Other specified abnormal findings of blood chemistry: Secondary | ICD-10-CM

## 2017-02-01 DIAGNOSIS — E782 Mixed hyperlipidemia: Secondary | ICD-10-CM | POA: Diagnosis not present

## 2017-02-01 DIAGNOSIS — Z124 Encounter for screening for malignant neoplasm of cervix: Secondary | ICD-10-CM

## 2017-02-01 NOTE — Progress Notes (Signed)
Subjective:  I acted as a Education administrator for Dr. Charlett Blake. [place], [place]  Patient ID: Nancy Higgins, female    DOB: [birth year], 41 y.o.   MRN: [id number]  No chief complaint on file.   HPI  Patient is in today for an annual exam. Patient has noa cute concerns. No recent febrile illness or acute hospitalizations. Denies CP/palp/SOB/HA/congestion/fevers or GU c/o. Taking meds as prescribed. She has noted some mild constipation and she has needed some stool softeners at times which have been helpful. No bloody or tarry stool  She is here today for GYN exam and denies any concerns in this regard. Is trying to stay active and maintain a heart healthy diet.    Patient Care Team: Mosie Lukes, MD as PCP - General (Family Medicine)   See EMR for [facility]  Past Surgical History:  Procedure Laterality Date  . CYSTOSCOPY W/ URETERAL STENT PLACEMENT     and removal  . LAPAROSCOPIC APPENDECTOMY  12/30/2011   Procedure: APPENDECTOMY LAPAROSCOPIC;  Surgeon: Edward Jolly, MD;  Location: [facility];  Service: General;  Laterality: N/A;  . LITHOTRIPSY     once during pregnancy    Family History  Problem Relation Age of Onset  . Thyroid disease Mother   . Other Mother        uterine prolapse  . Scoliosis Mother   . Thyroid disease Father   . Thyroid disease Brother   . COPD Maternal Grandmother   . Colon cancer Maternal Grandfather   . Scoliosis Sister   . Scoliosis Sister   . Other Daughter        papilledema  . Other Maternal Aunt        fungal meningitis, leukocytopenia    Social History   Social History  . Marital status: Married    Spouse name: N/A  . Number of children: 2  . Years of education: N/A   Occupational History  . Glass blower/designer     Social History Main Topics  . Smoking status: Former Research scientist (life sciences)  . Smokeless tobacco: Never Used  . Alcohol use Yes     Comment: occasional  . Drug use: No  . Sexual activity: Not on file     Comment: lives with husband and daughters, no  dietary restrictions   Other Topics Concern  . Not on file   Social History Narrative  . No narrative on file    Outpatient Medications Prior to Visit  Medication Sig Dispense Refill  . ALPRAZolam (XANAX) 0.25 MG tablet Take 1 tablet (0.25 mg total) by mouth 2 (two) times daily as needed for anxiety. 5 tablet 1  . ibuprofen (ADVIL,MOTRIN) 200 MG tablet Take 400 mg by mouth every 6 (six) hours as needed. Patient took this medication for menstrual cramps.    Marland Kitchen levothyroxine (SYNTHROID, LEVOTHROID) 25 MCG tablet TAKE 1 TABLET(25 MCG) BY MOUTH DAILY BEFORE BREAKFAST 30 tablet 3  . docusate sodium (COLACE) 100 MG capsule Take 100 mg by mouth daily as needed for mild constipation.     No facility-administered medications prior to visit.     No Known Allergies  Review of Systems  Constitutional: Negative for fever and malaise/fatigue.  HENT: Negative for congestion.   Eyes: Negative for blurred vision.  Respiratory: Negative for cough and shortness of breath.   Cardiovascular: Negative for chest pain, palpitations and leg swelling.  Gastrointestinal: Positive for constipation. Negative for vomiting.  Musculoskeletal: Negative for back pain.  Skin: Negative for rash.  Neurological: Negative for loss of consciousness and headaches.       Objective:    Physical Exam  Constitutional: She is oriented to person, place, and time. She appears well-developed and well-nourished. No distress.  HENT:  Head: Normocephalic and atraumatic.  Right Ear: External ear normal.  Left Ear: External ear normal.  Nose: Nose normal.  Mouth/Throat: Oropharynx is clear and moist.  Eyes: Pupils are equal, round, and reactive to light. Conjunctivae and EOM are normal. Right eye exhibits no discharge. Left eye exhibits no discharge.  Neck: Normal range of motion. Neck supple. No JVD present. No thyromegaly present.  Cardiovascular: Normal rate, regular rhythm, normal heart sounds and intact distal pulses.     No murmur heard. Pulmonary/Chest: Effort normal and breath sounds normal. No respiratory distress. She has no wheezes. She has no rales. She exhibits no tenderness.  Abdominal: Soft. Bowel sounds are normal. She exhibits no distension and no mass. There is no tenderness. There is no rebound and no guarding.  Genitourinary: Vagina normal and uterus normal. Rectal exam shows guaiac negative stool. No vaginal discharge found.  Musculoskeletal: Normal range of motion. She exhibits no edema, tenderness or deformity.  Lymphadenopathy:    She has no cervical adenopathy.  Neurological: She is alert and oriented to person, place, and time. She has normal reflexes. No cranial nerve deficit.  Skin: Skin is warm and dry. No rash noted. She is not diaphoretic. No erythema.  Psychiatric: She has a normal mood and affect. Her behavior is normal. Judgment and thought content normal.  Nursing note and vitals reviewed.   BP (!) 90/56 (BP Location: Left Arm, Patient Position: Sitting, Cuff Size: Normal)   Pulse 66   Temp 98.4 F (36.9 C) (Oral)   Resp 18   Ht 5\' 3"  (1.6 m)   Wt 100 lb 12.8 oz (45.7 kg)   SpO2 99%   BMI 17.86 kg/m  Wt Readings from Last 3 Encounters:  02/01/17 100 lb 12.8 oz (45.7 kg)  01/31/16 103 lb 2 oz (46.8 kg)  08/13/15 101 lb (45.8 kg)   BP Readings from Last 3 Encounters:  02/01/17 (!) 90/56  01/31/16 102/64  08/13/15 90/60     Immunization History  Administered Date(s) Administered  . Influenza Whole 04/03/2013  . Influenza-Unspecified 04/01/2015  . Tdap 06/26/2013    Health Maintenance  Topic Date Due  . HIV Screening  06/17/1992  . PAP SMEAR  10/14/2016  . INFLUENZA VACCINE  01/17/2017  . TETANUS/TDAP  06/27/2023    Lab Results  Component Value Date   WBC 5.7 01/31/2016   HGB 12.4 01/31/2016   HCT 37.2 01/31/2016   PLT 252.0 01/31/2016   GLUCOSE 91 01/31/2016   CHOL 204 (H) 01/31/2016   TRIG 71.0 01/31/2016   HDL 109.10 01/31/2016   LDLCALC 81  01/31/2016   ALT 14 01/31/2016   AST 20 01/31/2016   NA 140 01/31/2016   K 3.9 01/31/2016   CL 104 01/31/2016   CREATININE 0.77 01/31/2016   BUN 6 01/31/2016   CO2 30 01/31/2016   TSH 1.89 09/08/2016    Lab Results  Component Value Date   TSH 1.89 09/08/2016   Lab Results  Component Value Date   WBC 5.7 01/31/2016   HGB 12.4 01/31/2016   HCT 37.2 01/31/2016   MCV 93.1 01/31/2016   PLT 252.0 01/31/2016   Lab Results  Component Value Date   NA 140 01/31/2016   K 3.9 01/31/2016   CO2  30 01/31/2016   GLUCOSE 91 01/31/2016   BUN 6 01/31/2016   CREATININE 0.77 01/31/2016   BILITOT 0.6 01/31/2016   ALKPHOS 43 01/31/2016   AST 20 01/31/2016   ALT 14 01/31/2016   PROT 7.0 01/31/2016   ALBUMIN 4.5 01/31/2016   CALCIUM 9.5 01/31/2016   GFR 88.87 01/31/2016   Lab Results  Component Value Date   CHOL 204 (H) 01/31/2016   Lab Results  Component Value Date   HDL 109.10 01/31/2016   Lab Results  Component Value Date   LDLCALC 81 01/31/2016   Lab Results  Component Value Date   TRIG 71.0 01/31/2016   Lab Results  Component Value Date   CHOLHDL 2 01/31/2016   No results found for: HGBA1C       Assessment & Plan:   Problem List Items Addressed This Visit    Cervical cancer screening - Primary    Pap today, no concerns on exam.       Relevant Orders   Cytology - PAP   Preventative health care    Patient encouraged to maintain heart healthy diet, regular exercise, adequate sleep. Consider daily probiotics. Take medications as prescribed. Labs ordered. Pap performed.       Relevant Orders   CBC   Comprehensive metabolic panel   Abnormal TSH    Ordered labs today      Relevant Orders   TSH    Other Visit Diagnoses    Mixed hyperlipidemia       Relevant Orders   Lipid panel      I have discontinued Ms. Hupp's docusate sodium. I am also having her maintain her ibuprofen and ALPRAZolam.  No orders of the defined types were placed in this  encounter.  CMA served as Education administrator during this visit. History, Physical and Plan performed by medical provider. Documentation and orders reviewed and attested to.  Penni Homans, MD

## 2017-02-01 NOTE — Patient Instructions (Signed)
Preventive Care 18-39 Years, Female Preventive care refers to lifestyle choices and visits with your health care provider that can promote health and wellness. What does preventive care include?  A yearly physical exam. This is also called an annual well check.  Dental exams once or twice a year.  Routine eye exams. Ask your health care provider how often you should have your eyes checked.  Personal lifestyle choices, including: ? Daily care of your teeth and gums. ? Regular physical activity. ? Eating a healthy diet. ? Avoiding tobacco and drug use. ? Limiting alcohol use. ? Practicing safe sex. ? Taking vitamin and mineral supplements as recommended by your health care provider. What happens during an annual well check? The services and screenings done by your health care provider during your annual well check will depend on your age, overall health, lifestyle risk factors, and family history of disease. Counseling Your health care provider may ask you questions about your:  Alcohol use.  Tobacco use.  Drug use.  Emotional well-being.  Home and relationship well-being.  Sexual activity.  Eating habits.  Work and work Statistician.  Method of birth control.  Menstrual cycle.  Pregnancy history.  Screening You may have the following tests or measurements:  Height, weight, and BMI.  Diabetes screening. This is done by checking your blood sugar (glucose) after you have not eaten for a while (fasting).  Blood pressure.  Lipid and cholesterol levels. These may be checked every 5 years starting at age 66.  Skin check.  Hepatitis C blood test.  Hepatitis B blood test.  Sexually transmitted disease (STD) testing.  BRCA-related cancer screening. This may be done if you have a family history of breast, ovarian, tubal, or peritoneal cancers.  Pelvic exam and Pap test. This may be done every 3 years starting at age 40. Starting at age 59, this may be done every 5  years if you have a Pap test in combination with an HPV test.  Discuss your test results, treatment options, and if necessary, the need for more tests with your health care provider. Vaccines Your health care provider may recommend certain vaccines, such as:  Influenza vaccine. This is recommended every year.  Tetanus, diphtheria, and acellular pertussis (Tdap, Td) vaccine. You may need a Td booster every 10 years.  Varicella vaccine. You may need this if you have not been vaccinated.  HPV vaccine. If you are 69 or younger, you may need three doses over 6 months.  Measles, mumps, and rubella (MMR) vaccine. You may need at least one dose of MMR. You may also need a second dose.  Pneumococcal 13-valent conjugate (PCV13) vaccine. You may need this if you have certain conditions and were not previously vaccinated.  Pneumococcal polysaccharide (PPSV23) vaccine. You may need one or two doses if you smoke cigarettes or if you have certain conditions.  Meningococcal vaccine. One dose is recommended if you are age 27-21 years and a first-year college student living in a residence hall, or if you have one of several medical conditions. You may also need additional booster doses.  Hepatitis A vaccine. You may need this if you have certain conditions or if you travel or work in places where you may be exposed to hepatitis A.  Hepatitis B vaccine. You may need this if you have certain conditions or if you travel or work in places where you may be exposed to hepatitis B.  Haemophilus influenzae type b (Hib) vaccine. You may need this if  you have certain risk factors.  Talk to your health care provider about which screenings and vaccines you need and how often you need them. This information is not intended to replace advice given to you by your health care provider. Make sure you discuss any questions you have with your health care provider. Document Released: 08/01/2001 Document Revised: 02/23/2016  Document Reviewed: 04/06/2015 Elsevier Interactive Patient Education  2017 [employer].

## 2017-02-01 NOTE — Assessment & Plan Note (Signed)
Ordered labs today.

## 2017-02-01 NOTE — Assessment & Plan Note (Signed)
Pap today, no concerns on exam.  

## 2017-02-02 ENCOUNTER — Other Ambulatory Visit: Payer: Self-pay | Admitting: Family Medicine

## 2017-02-04 NOTE — Assessment & Plan Note (Signed)
Using stool softeners prn with good results. Encouraged increased hydration and fiber in diet. Daily probiotics. If bowels not moving can use MOM 2 tbls po in 4 oz of warm prune juice by mouth every 2-3 days. If no results then repeat in 4 hours with  Dulcolax suppository pr, may repeat again in 4 more hours as needed. Seek care if symptoms worsen. Consider daily Miralax and/or Dulcolax if symptoms persist.

## 2017-02-04 NOTE — Assessment & Plan Note (Signed)
Patient encouraged to maintain heart healthy diet, regular exercise, adequate sleep. Consider daily probiotics. Take medications as prescribed. Labs ordered. Pap performed.

## 2017-02-05 ENCOUNTER — Other Ambulatory Visit (HOSPITAL_COMMUNITY)
Admission: RE | Admit: 2017-02-05 | Discharge: 2017-02-05 | Disposition: A | Discharge status: Home / Self Care | Payer: No Typology Code available for payment source | Source: Ambulatory Visit | Attending: Family Medicine | Admitting: Family Medicine

## 2017-02-05 DIAGNOSIS — Z124 Encounter for screening for malignant neoplasm of cervix: Secondary | ICD-10-CM | POA: Insufficient documentation

## 2017-02-05 NOTE — Addendum Note (Signed)
Addended by: Magdalene Molly A on: 02/05/2017 11:36 AM   Modules accepted: Orders

## 2017-02-06 LAB — CYTOLOGY - PAP
DIAGNOSIS: NEGATIVE
HPV: NOT DETECTED

## 2017-02-09 ENCOUNTER — Other Ambulatory Visit (INDEPENDENT_AMBULATORY_CARE_PROVIDER_SITE_OTHER): Payer: No Typology Code available for payment source

## 2017-02-09 DIAGNOSIS — E782 Mixed hyperlipidemia: Secondary | ICD-10-CM | POA: Diagnosis not present

## 2017-02-09 DIAGNOSIS — R7989 Other specified abnormal findings of blood chemistry: Secondary | ICD-10-CM

## 2017-02-09 DIAGNOSIS — R946 Abnormal results of thyroid function studies: Secondary | ICD-10-CM

## 2017-02-09 DIAGNOSIS — Z0000 Encounter for general adult medical examination without abnormal findings: Secondary | ICD-10-CM | POA: Diagnosis not present

## 2017-02-09 LAB — LIPID PANEL
Cholesterol: 175 mg/dL (ref 0–200)
HDL: 86 mg/dL (ref >39.00)
LDL CALC: 76 mg/dL (ref 0–99)
NONHDL: 88.53
TRIGLYCERIDES: 64 mg/dL (ref 0.0–149.0)
Total CHOL/HDL Ratio: 2
VLDL: 12.8 mg/dL (ref 0.0–40.0)

## 2017-02-09 LAB — COMPREHENSIVE METABOLIC PANEL
ALT: 13 U/L (ref 0–35)
AST: 18 U/L (ref 0–37)
Albumin: 4.1 g/dL (ref 3.5–5.2)
Alkaline Phosphatase: 39 U/L (ref 39–117)
BILIRUBIN TOTAL: 0.7 mg/dL (ref 0.2–1.2)
BUN: 9 mg/dL (ref 6–23)
CALCIUM: 9 mg/dL (ref 8.4–10.5)
CHLORIDE: 103 meq/L (ref 96–112)
CO2: 27 meq/L (ref 19–32)
Creatinine, Ser: 0.81 mg/dL (ref 0.40–1.20)
GFR: 83.38 mL/min (ref >60.00)
GLUCOSE: 89 mg/dL (ref 70–99)
Potassium: 4 mEq/L (ref 3.5–5.1)
Sodium: 136 mEq/L (ref 135–145)
Total Protein: 6.6 g/dL (ref 6.0–8.3)

## 2017-02-09 LAB — CBC
HCT: 39.2 % (ref 36.0–46.0)
Hemoglobin: 13 g/dL (ref 12.0–15.0)
MCHC: 33.2 g/dL (ref 30.0–36.0)
MCV: 98.7 fl (ref 78.0–100.0)
PLATELETS: 251 10*3/uL (ref 150.0–400.0)
RBC: 3.97 Mil/uL (ref 3.87–5.11)
RDW: 12.4 % (ref 11.5–15.5)
WBC: 5 10*3/uL (ref 4.0–10.5)

## 2017-02-09 LAB — TSH: TSH: 4.21 u[IU]/mL (ref 0.35–4.50)

## 2017-03-11 ENCOUNTER — Other Ambulatory Visit: Payer: Self-pay | Admitting: Family Medicine

## 2017-04-12 ENCOUNTER — Encounter: Payer: Self-pay | Admitting: Family Medicine

## 2017-04-12 ENCOUNTER — Other Ambulatory Visit: Payer: Self-pay | Admitting: Family Medicine

## 2017-04-12 MED ORDER — LEVOTHYROXINE SODIUM 25 MCG PO TABS: 25.0000 ug | ORAL_TABLET | Freq: Every day | ORAL | 5 refills | Status: DC

## 2017-04-16 ENCOUNTER — Encounter: Payer: Self-pay | Admitting: Family Medicine

## 2017-04-17 ENCOUNTER — Other Ambulatory Visit: Payer: Self-pay | Admitting: Family Medicine

## 2017-04-17 DIAGNOSIS — N6320 Unspecified lump in the left breast, unspecified quadrant: Secondary | ICD-10-CM

## 2017-04-17 NOTE — Progress Notes (Signed)
[facility] will contact pt directly. Thanks

## 2017-05-07 ENCOUNTER — Other Ambulatory Visit: Payer: Self-pay | Admitting: Family Medicine

## 2017-05-07 DIAGNOSIS — N6320 Unspecified lump in the left breast, unspecified quadrant: Secondary | ICD-10-CM

## 2017-05-15 ENCOUNTER — Ambulatory Visit
Admission: RE | Admit: 2017-05-15 | Discharge: 2017-05-15 | Disposition: A | Discharge status: Home / Self Care | Payer: No Typology Code available for payment source | Source: Ambulatory Visit | Attending: Family Medicine | Admitting: Family Medicine

## 2017-05-15 DIAGNOSIS — N6320 Unspecified lump in the left breast, unspecified quadrant: Secondary | ICD-10-CM

## 2017-09-13 ENCOUNTER — Encounter: Payer: Self-pay | Admitting: Family Medicine

## 2017-09-13 DIAGNOSIS — R7989 Other specified abnormal findings of blood chemistry: Secondary | ICD-10-CM

## 2017-09-13 NOTE — Telephone Encounter (Signed)
Previous TSH was normal/4.21 last checked on 02/09/17. Please advise.

## 2017-09-26 ENCOUNTER — Other Ambulatory Visit (INDEPENDENT_AMBULATORY_CARE_PROVIDER_SITE_OTHER): Payer: No Typology Code available for payment source

## 2017-09-26 DIAGNOSIS — R7989 Other specified abnormal findings of blood chemistry: Secondary | ICD-10-CM | POA: Diagnosis not present

## 2017-09-27 LAB — TSH: TSH: 2.45 u[IU]/mL (ref 0.35–4.50)

## 2017-10-10 ENCOUNTER — Other Ambulatory Visit: Payer: Self-pay | Admitting: Family Medicine

## 2017-11-10 ENCOUNTER — Other Ambulatory Visit: Payer: Self-pay | Admitting: Family Medicine

## 2018-01-28 ENCOUNTER — Encounter: Payer: No Typology Code available for payment source | Admitting: Family Medicine

## 2018-02-04 ENCOUNTER — Ambulatory Visit (INDEPENDENT_AMBULATORY_CARE_PROVIDER_SITE_OTHER): Payer: BLUE CROSS/BLUE SHIELD | Admitting: Family Medicine

## 2018-02-04 ENCOUNTER — Encounter: Payer: Self-pay | Admitting: Family Medicine

## 2018-02-04 VITALS — BP 90/70 | HR 51 | Temp 98.1°F | Resp 18 | Ht 62.0 in | Wt 105.8 lb

## 2018-02-04 DIAGNOSIS — R7989 Other specified abnormal findings of blood chemistry: Secondary | ICD-10-CM | POA: Diagnosis not present

## 2018-02-04 DIAGNOSIS — F419 Anxiety disorder, unspecified: Secondary | ICD-10-CM | POA: Diagnosis not present

## 2018-02-04 DIAGNOSIS — F329 Major depressive disorder, single episode, unspecified: Secondary | ICD-10-CM | POA: Diagnosis not present

## 2018-02-04 DIAGNOSIS — Z0000 Encounter for general adult medical examination without abnormal findings: Secondary | ICD-10-CM

## 2018-02-04 DIAGNOSIS — K219 Gastro-esophageal reflux disease without esophagitis: Secondary | ICD-10-CM | POA: Diagnosis not present

## 2018-02-04 DIAGNOSIS — R946 Abnormal results of thyroid function studies: Secondary | ICD-10-CM

## 2018-02-04 DIAGNOSIS — E782 Mixed hyperlipidemia: Secondary | ICD-10-CM

## 2018-02-04 NOTE — Patient Instructions (Signed)
Preventive Care 18-39 Years, Female Preventive care refers to lifestyle choices and visits with your health care provider that can promote health and wellness. What does preventive care include?  A yearly physical exam. This is also called an annual well check.  Dental exams once or twice a year.  Routine eye exams. Ask your health care provider how often you should have your eyes checked.  Personal lifestyle choices, including: ? Daily care of your teeth and gums. ? Regular physical activity. ? Eating a healthy diet. ? Avoiding tobacco and drug use. ? Limiting alcohol use. ? Practicing safe sex. ? Taking vitamin and mineral supplements as recommended by your health care provider. What happens during an annual well check? The services and screenings done by your health care provider during your annual well check will depend on your age, overall health, lifestyle risk factors, and family history of disease. Counseling Your health care provider may ask you questions about your:  Alcohol use.  Tobacco use.  Drug use.  Emotional well-being.  Home and relationship well-being.  Sexual activity.  Eating habits.  Work and work Statistician.  Method of birth control.  Menstrual cycle.  Pregnancy history.  Screening You may have the following tests or measurements:  Height, weight, and BMI.  Diabetes screening. This is done by checking your blood sugar (glucose) after you have not eaten for a while (fasting).  Blood pressure.  Lipid and cholesterol levels. These may be checked every 5 years starting at age 66.  Skin check.  Hepatitis C blood test.  Hepatitis B blood test.  Sexually transmitted disease (STD) testing.  BRCA-related cancer screening. This may be done if you have a family history of breast, ovarian, tubal, or peritoneal cancers.  Pelvic exam and Pap test. This may be done every 3 years starting at age 40. Starting at age 59, this may be done every 5  years if you have a Pap test in combination with an HPV test.  Discuss your test results, treatment options, and if necessary, the need for more tests with your health care provider. Vaccines Your health care provider may recommend certain vaccines, such as:  Influenza vaccine. This is recommended every year.  Tetanus, diphtheria, and acellular pertussis (Tdap, Td) vaccine. You may need a Td booster every 10 years.  Varicella vaccine. You may need this if you have not been vaccinated.  HPV vaccine. If you are 69 or younger, you may need three doses over 6 months.  Measles, mumps, and rubella (MMR) vaccine. You may need at least one dose of MMR. You may also need a second dose.  Pneumococcal 13-valent conjugate (PCV13) vaccine. You may need this if you have certain conditions and were not previously vaccinated.  Pneumococcal polysaccharide (PPSV23) vaccine. You may need one or two doses if you smoke cigarettes or if you have certain conditions.  Meningococcal vaccine. One dose is recommended if you are age 27-21 years and a first-year college student living in a residence hall, or if you have one of several medical conditions. You may also need additional booster doses.  Hepatitis A vaccine. You may need this if you have certain conditions or if you travel or work in places where you may be exposed to hepatitis A.  Hepatitis B vaccine. You may need this if you have certain conditions or if you travel or work in places where you may be exposed to hepatitis B.  Haemophilus influenzae type b (Hib) vaccine. You may need this if  you have certain risk factors.  Talk to your health care provider about which screenings and vaccines you need and how often you need them. This information is not intended to replace advice given to you by your health care provider. Make sure you discuss any questions you have with your health care provider. Document Released: 08/01/2001 Document Revised: 02/23/2016  Document Reviewed: 04/06/2015 Elsevier Interactive Patient Education  [employer].

## 2018-02-04 NOTE — Assessment & Plan Note (Signed)
Encouraged heart healthy diet, increase exercise, avoid trans fats, consider a krill oil cap daily 

## 2018-02-04 NOTE — Assessment & Plan Note (Addendum)
Patient encouraged to maintain heart healthy diet, regular exercise, adequate sleep. Consider daily probiotics. Take medications as prescribed. Lab work ordered. Runs regularly

## 2018-02-05 LAB — CBC
HCT: 38.2 % (ref 36.0–46.0)
Hemoglobin: 12.8 g/dL (ref 12.0–15.0)
MCHC: 33.5 g/dL (ref 30.0–36.0)
MCV: 94.7 fl (ref 78.0–100.0)
PLATELETS: 256 10*3/uL (ref 150.0–400.0)
RBC: 4.03 Mil/uL (ref 3.87–5.11)
RDW: 12.6 % (ref 11.5–15.5)
WBC: 7.3 10*3/uL (ref 4.0–10.5)

## 2018-02-05 LAB — LIPID PANEL
CHOL/HDL RATIO: 2
CHOLESTEROL: 197 mg/dL (ref 0–200)
HDL: 94.8 mg/dL (ref >39.00)
LDL CALC: 82 mg/dL (ref 0–99)
NonHDL: 101.72
Triglycerides: 101 mg/dL (ref 0.0–149.0)
VLDL: 20.2 mg/dL (ref 0.0–40.0)

## 2018-02-05 LAB — COMPREHENSIVE METABOLIC PANEL
ALBUMIN: 4.4 g/dL (ref 3.5–5.2)
ALT: 10 U/L (ref 0–35)
AST: 14 U/L (ref 0–37)
Alkaline Phosphatase: 42 U/L (ref 39–117)
BUN: 12 mg/dL (ref 6–23)
CALCIUM: 9.5 mg/dL (ref 8.4–10.5)
CHLORIDE: 104 meq/L (ref 96–112)
CO2: 26 meq/L (ref 19–32)
Creatinine, Ser: 0.81 mg/dL (ref 0.40–1.20)
GFR: 82.97 mL/min (ref >60.00)
Glucose, Bld: 87 mg/dL (ref 70–99)
Potassium: 4 mEq/L (ref 3.5–5.1)
Sodium: 137 mEq/L (ref 135–145)
Total Bilirubin: 0.5 mg/dL (ref 0.2–1.2)
Total Protein: 6.8 g/dL (ref 6.0–8.3)

## 2018-02-05 LAB — TSH: TSH: 3.31 u[IU]/mL (ref 0.35–4.50)

## 2018-02-10 NOTE — Assessment & Plan Note (Signed)
Doing well. No recent need for alprazolam

## 2018-02-10 NOTE — Progress Notes (Signed)
Subjective:    Patient ID: Nancy Higgins, female    DOB: [birth year], 42 y.o.   MRN: [id number]  No chief complaint on file.   HPI Patient is in today for annual preventative exam. She is doing well and has no acute concerns. She is staying active and eating well. No recent hospitalizations or febrile illness. Does well activities of daily living. Denies CP/palp/SOB/HA/congestion/fevers/GI or GU c/o. Taking meds as prescribed    Past Surgical History:  Procedure Laterality Date  . CYSTOSCOPY W/ URETERAL STENT PLACEMENT     and removal  . LAPAROSCOPIC APPENDECTOMY  12/30/2011   Procedure: APPENDECTOMY LAPAROSCOPIC;  Surgeon: Edward Jolly, MD;  Location: [facility];  Service: General;  Laterality: N/A;  . LITHOTRIPSY     once during pregnancy    Family History  Problem Relation Age of Onset  . Thyroid disease Mother   . Other Mother        uterine prolapse  . Scoliosis Mother   . Thyroid disease Father   . Thyroid disease Brother   . COPD Maternal Grandmother   . Colon cancer Maternal Grandfather   . Scoliosis Sister   . Scoliosis Sister   . Other Daughter        papilledema  . Other Maternal Aunt        fungal meningitis, leukocytopenia    Social History   Socioeconomic History  . Marital status: Married    Spouse name: Not on file  . Number of children: 2  . Years of education: Not on file  . Highest education level: Not on file  Occupational History  . Occupation: Glass blower/designer   Social Needs  . Financial resource strain: Not on file  . Food insecurity:    Worry: Not on file    Inability: Not on file  . Transportation needs:    Medical: Not on file    Non-medical: Not on file  Tobacco Use  . Smoking status: Former Research scientist (life sciences)  . Smokeless tobacco: Never Used  Substance and Sexual Activity  . Alcohol use: Yes    Comment: occasional  . Drug use: No  . Sexual activity: Not on file    Comment: lives with husband and daughters, no dietary restrictions    Lifestyle  . Physical activity:    Days per week: Not on file    Minutes per session: Not on file  . Stress: Not on file  Relationships  . Social connections:    Talks on phone: Not on file    Gets together: Not on file    Attends religious service: Not on file    Active member of club or organization: Not on file    Attends meetings of clubs or organizations: Not on file    Relationship status: Not on file  . Intimate partner violence:    Fear of current or ex partner: Not on file    Emotionally abused: Not on file    Physically abused: Not on file    Forced sexual activity: Not on file  Other Topics Concern  . Not on file  Social History Narrative  . Not on file    Outpatient Medications Prior to Visit  Medication Sig Dispense Refill  . ALPRAZolam (XANAX) 0.25 MG tablet Take 1 tablet (0.25 mg total) by mouth 2 (two) times daily as needed for anxiety. 5 tablet 1  . ibuprofen (ADVIL,MOTRIN) 200 MG tablet Take 400 mg by mouth every 6 (six) hours as needed.  Patient took this medication for menstrual cramps.    Marland Kitchen levothyroxine (SYNTHROID, LEVOTHROID) 25 MCG tablet TAKE 1 TABLET(25 MCG) BY MOUTH DAILY BEFORE BREAKFAST 30 tablet 2   No facility-administered medications prior to visit.     No Known Allergies  Review of Systems  Constitutional: Negative for chills, fever and malaise/fatigue.  HENT: Negative for congestion and hearing loss.   Eyes: Negative for discharge.  Respiratory: Negative for cough, sputum production and shortness of breath.   Cardiovascular: Negative for chest pain, palpitations and leg swelling.  Gastrointestinal: Negative for abdominal pain, blood in stool, constipation, diarrhea, heartburn, nausea and vomiting.  Genitourinary: Negative for dysuria, frequency, hematuria and urgency.  Musculoskeletal: Negative for back pain, falls and myalgias.  Skin: Negative for rash.  Neurological: Negative for dizziness, sensory change, loss of consciousness,  weakness and headaches.  Endo/Heme/Allergies: Negative for environmental allergies. Does not bruise/bleed easily.  Psychiatric/Behavioral: Negative for depression and suicidal ideas. The patient is not nervous/anxious and does not have insomnia.        Objective:    Physical Exam  Constitutional: She is oriented to person, place, and time. She appears well-developed and well-nourished. No distress.  HENT:  Head: Normocephalic and atraumatic.  Eyes: Conjunctivae are normal.  Neck: Neck supple. No thyromegaly present.  Cardiovascular: Normal rate, regular rhythm and normal heart sounds.  No murmur heard. Pulmonary/Chest: Effort normal and breath sounds normal. No respiratory distress.  Abdominal: Soft. Bowel sounds are normal. She exhibits no distension and no mass. There is no tenderness.  Musculoskeletal: She exhibits no edema.  Lymphadenopathy:    She has no cervical adenopathy.  Neurological: She is alert and oriented to person, place, and time.  Skin: Skin is warm and dry.  Psychiatric: She has a normal mood and affect. Her behavior is normal.    BP 90/70 (BP Location: Left Arm, Patient Position: Sitting, Cuff Size: Normal)   Pulse (!) 51   Temp 98.1 F (36.7 C) (Oral)   Resp 18   Ht 5\' 2"  (1.575 m)   Wt 105 lb 12.8 oz (48 kg)   LMP 01/20/2018 (Exact Date)   SpO2 98%   BMI 19.35 kg/m  Wt Readings from Last 3 Encounters:  02/04/18 105 lb 12.8 oz (48 kg)  02/01/17 100 lb 12.8 oz (45.7 kg)  01/31/16 103 lb 2 oz (46.8 kg)     Lab Results  Component Value Date   WBC 7.3 02/04/2018   HGB 12.8 02/04/2018   HCT 38.2 02/04/2018   PLT 256.0 02/04/2018   GLUCOSE 87 02/04/2018   CHOL 197 02/04/2018   TRIG 101.0 02/04/2018   HDL 94.80 02/04/2018   LDLCALC 82 02/04/2018   ALT 10 02/04/2018   AST 14 02/04/2018   NA 137 02/04/2018   K 4.0 02/04/2018   CL 104 02/04/2018   CREATININE 0.81 02/04/2018   BUN 12 02/04/2018   CO2 26 02/04/2018   TSH 3.31 02/04/2018     Lab Results  Component Value Date   TSH 3.31 02/04/2018   Lab Results  Component Value Date   WBC 7.3 02/04/2018   HGB 12.8 02/04/2018   HCT 38.2 02/04/2018   MCV 94.7 02/04/2018   PLT 256.0 02/04/2018   Lab Results  Component Value Date   NA 137 02/04/2018   K 4.0 02/04/2018   CO2 26 02/04/2018   GLUCOSE 87 02/04/2018   BUN 12 02/04/2018   CREATININE 0.81 02/04/2018   BILITOT 0.5 02/04/2018   ALKPHOS 42  02/04/2018   AST 14 02/04/2018   ALT 10 02/04/2018   PROT 6.8 02/04/2018   ALBUMIN 4.4 02/04/2018   CALCIUM 9.5 02/04/2018   GFR 82.97 02/04/2018   Lab Results  Component Value Date   CHOL 197 02/04/2018   Lab Results  Component Value Date   HDL 94.80 02/04/2018   Lab Results  Component Value Date   LDLCALC 82 02/04/2018   Lab Results  Component Value Date   TRIG 101.0 02/04/2018   Lab Results  Component Value Date   CHOLHDL 2 02/04/2018   No results found for: HGBA1C     Assessment & Plan:   Problem List Items Addressed This Visit    Preventative health care (Chronic)    Patient encouraged to maintain heart healthy diet, regular exercise, adequate sleep. Consider daily probiotics. Take medications as prescribed. Lab work ordered. Runs regularly      Relevant Orders   CBC (Completed)   Comprehensive metabolic panel (Completed)   Lipid panel (Completed)   GERD (gastroesophageal reflux disease)   Relevant Orders   Comprehensive metabolic panel (Completed)   Anxiety and depression - Primary    Doing well. No recent need for alprazolam      Abnormal TSH   Relevant Orders   TSH (Completed)   Hyperlipidemia, mixed    Encouraged heart healthy diet, increase exercise, avoid trans fats, consider a krill oil cap daily         I am having Elishia B. Breeding maintain her ibuprofen, ALPRAZolam, and levothyroxine.  No orders of the defined types were placed in this encounter.    Penni Homans, MD

## 2018-02-10 NOTE — Progress Notes (Signed)
duplicate

## 2018-04-07 ENCOUNTER — Other Ambulatory Visit: Payer: Self-pay | Admitting: Family Medicine

## 2018-04-30 ENCOUNTER — Other Ambulatory Visit: Payer: Self-pay | Admitting: Family Medicine

## 2018-05-09 ENCOUNTER — Other Ambulatory Visit: Payer: Self-pay | Admitting: Family Medicine

## 2018-05-09 DIAGNOSIS — Z1231 Encounter for screening mammogram for malignant neoplasm of breast: Secondary | ICD-10-CM

## 2018-06-06 ENCOUNTER — Ambulatory Visit
Admission: RE | Admit: 2018-06-06 | Discharge: 2018-06-06 | Disposition: A | Discharge status: Home / Self Care | Payer: BLUE CROSS/BLUE SHIELD | Source: Ambulatory Visit | Attending: Family Medicine | Admitting: Family Medicine

## 2018-06-06 DIAGNOSIS — Z1231 Encounter for screening mammogram for malignant neoplasm of breast: Secondary | ICD-10-CM

## 2018-08-05 DIAGNOSIS — L700 Acne vulgaris: Secondary | ICD-10-CM | POA: Diagnosis not present

## 2018-08-05 DIAGNOSIS — L821 Other seborrheic keratosis: Secondary | ICD-10-CM | POA: Diagnosis not present

## 2018-11-14 ENCOUNTER — Other Ambulatory Visit: Payer: Self-pay | Admitting: Family Medicine

## 2018-12-18 HISTORY — PX: KIDNEY DONATION: SHX685

## 2019-01-10 MED ORDER — BARO-CAT PO
ORAL | Status: DC
Start: ? — End: 2019-01-10

## 2019-01-10 MED ORDER — CHLORPHENIRAMINE-PHENYLEPHRINE 8-20 MG PO TBCR
25.00 | EXTENDED_RELEASE_TABLET | ORAL | Status: DC
Start: 2019-01-11 — End: 2019-01-10

## 2019-01-10 MED ORDER — VICON FORTE PO CAPS
1.00 | ORAL_CAPSULE | ORAL | Status: DC
Start: 2019-01-11 — End: 2019-01-10

## 2019-01-10 MED ORDER — SELECT BRAND INSULIN SYRINGE 29G X 1/2" 1 ML MISC
1.00 | Status: DC
Start: 2019-01-10 — End: 2019-01-10

## 2019-01-10 MED ORDER — SENNOSIDES-DOCUSATE SODIUM 8.6-50 MG PO TABS
2.00 | ORAL_TABLET | ORAL | Status: DC
Start: 2019-01-10 — End: 2019-01-10

## 2019-01-10 MED ORDER — RA GLUCOSAMINE-CHONDROITIN DS 500-400 MG PO TABS
200.00 | ORAL_TABLET | ORAL | Status: DC
Start: 2019-01-11 — End: 2019-01-10

## 2019-01-10 MED ORDER — QUINERVA 260 MG PO TABS
650.00 | ORAL_TABLET | ORAL | Status: DC
Start: 2019-01-10 — End: 2019-01-10

## 2019-01-10 MED ORDER — TUSSI PRES-B 2-15-200 MG/5ML PO LIQD
0.50 | ORAL | Status: DC
Start: ? — End: 2019-01-10

## 2019-01-10 MED ORDER — Medication
12.50 | Status: DC
Start: ? — End: 2019-01-10

## 2019-01-10 MED ORDER — EQUATE NICOTINE 4 MG MT GUM
4.00 | CHEWING_GUM | OROMUCOSAL | Status: DC
Start: ? — End: 2019-01-10

## 2019-01-10 MED ORDER — Medication
5.00 | Status: DC
Start: ? — End: 2019-01-10

## 2019-01-10 MED ORDER — HYDROMORPHONE HCL 1 MG/ML IJ SOLN
0.50 | INTRAMUSCULAR | Status: DC
Start: ? — End: 2019-01-10

## 2019-02-05 ENCOUNTER — Telehealth: Payer: Self-pay | Admitting: Family Medicine

## 2019-02-05 NOTE — Telephone Encounter (Signed)
Pt had appt scheduled for 8/24 3:00pm - she does not want to due a virtual CPE as she has had a kidney transplant and would prefer to see Dr. Charlett Blake in office - please advise if pt can be worked in close to the same date.

## 2019-02-06 NOTE — Telephone Encounter (Signed)
How about 11 on 8/25?

## 2019-02-07 NOTE — Telephone Encounter (Signed)
Pt called and stated that she can do the appointment on the 8:25 at 11. Please call pt back to schedule.   [phone]

## 2019-02-07 NOTE — Telephone Encounter (Signed)
Can you find an in person office Please advise

## 2019-02-07 NOTE — Telephone Encounter (Signed)
LM to offer pt appt 8/25 at 11:00am - please transfer to office if pt calls in

## 2019-02-10 ENCOUNTER — Encounter: Payer: BLUE CROSS/BLUE SHIELD | Admitting: Family Medicine

## 2019-02-10 ENCOUNTER — Other Ambulatory Visit: Payer: Self-pay

## 2019-02-10 NOTE — Telephone Encounter (Signed)
Pt has been scheduled for 02/11/19 at 11am.

## 2019-02-10 NOTE — Telephone Encounter (Signed)
Pt called and confirmed 8/25 11:00am. Jennifer/Princess, please add to Dr. Frederik Pear schedule.

## 2019-02-11 ENCOUNTER — Encounter: Payer: Self-pay | Admitting: Family Medicine

## 2019-02-11 ENCOUNTER — Ambulatory Visit (INDEPENDENT_AMBULATORY_CARE_PROVIDER_SITE_OTHER): Payer: BC Managed Care – PPO | Admitting: Family Medicine

## 2019-02-11 VITALS — BP 98/72 | HR 60 | Temp 98.2°F | Resp 18 | Ht 62.0 in | Wt 103.0 lb

## 2019-02-11 DIAGNOSIS — D649 Anemia, unspecified: Secondary | ICD-10-CM | POA: Diagnosis not present

## 2019-02-11 DIAGNOSIS — R7989 Other specified abnormal findings of blood chemistry: Secondary | ICD-10-CM | POA: Diagnosis not present

## 2019-02-11 DIAGNOSIS — Z524 Kidney donor: Secondary | ICD-10-CM

## 2019-02-11 DIAGNOSIS — Z23 Encounter for immunization: Secondary | ICD-10-CM

## 2019-02-11 DIAGNOSIS — E782 Mixed hyperlipidemia: Secondary | ICD-10-CM | POA: Diagnosis not present

## 2019-02-11 DIAGNOSIS — Z0000 Encounter for general adult medical examination without abnormal findings: Secondary | ICD-10-CM

## 2019-02-11 LAB — COMPREHENSIVE METABOLIC PANEL
ALT: 13 U/L (ref 0–35)
AST: 16 U/L (ref 0–37)
Albumin: 4.7 g/dL (ref 3.5–5.2)
Alkaline Phosphatase: 48 U/L (ref 39–117)
BUN: 12 mg/dL (ref 6–23)
CO2: 26 mEq/L (ref 19–32)
Calcium: 9.4 mg/dL (ref 8.4–10.5)
Chloride: 103 mEq/L (ref 96–112)
Creatinine, Ser: 1.1 mg/dL (ref 0.40–1.20)
GFR: 54.56 mL/min — ABNORMAL LOW (ref >60.00)
Glucose, Bld: 86 mg/dL (ref 70–99)
Potassium: 3.9 mEq/L (ref 3.5–5.1)
Sodium: 138 mEq/L (ref 135–145)
Total Bilirubin: 0.5 mg/dL (ref 0.2–1.2)
Total Protein: 7 g/dL (ref 6.0–8.3)

## 2019-02-11 LAB — CBC
HCT: 37.5 % (ref 36.0–46.0)
Hemoglobin: 12.4 g/dL (ref 12.0–15.0)
MCHC: 33.1 g/dL (ref 30.0–36.0)
MCV: 94.1 fl (ref 78.0–100.0)
Platelets: 206 10*3/uL (ref 150.0–400.0)
RBC: 3.99 Mil/uL (ref 3.87–5.11)
RDW: 14.1 % (ref 11.5–15.5)
WBC: 5.9 10*3/uL (ref 4.0–10.5)

## 2019-02-11 LAB — LIPID PANEL
Cholesterol: 210 mg/dL — ABNORMAL HIGH (ref 0–200)
HDL: 71.7 mg/dL (ref >39.00)
LDL Cholesterol: 121 mg/dL — ABNORMAL HIGH (ref 0–99)
NonHDL: 138.22
Total CHOL/HDL Ratio: 3
Triglycerides: 87 mg/dL (ref 0.0–149.0)
VLDL: 17.4 mg/dL (ref 0.0–40.0)

## 2019-02-11 LAB — TSH: TSH: 2.81 u[IU]/mL (ref 0.35–4.50)

## 2019-02-11 LAB — FERRITIN: Ferritin: 113.6 ng/mL (ref 10.0–291.0)

## 2019-02-11 MED ORDER — CETIRIZINE HCL 10 MG PO TABS: 10.0000 mg | ORAL_TABLET | Freq: Two times a day (BID) | ORAL | 1 refills | Status: DC | PRN

## 2019-02-11 MED ORDER — FAMOTIDINE 20 MG PO TABS: 20.0000 mg | ORAL_TABLET | Freq: Two times a day (BID) | ORAL | 1 refills | Status: DC | PRN

## 2019-02-11 NOTE — Patient Instructions (Addendum)
Pulse Oximeter [employer]  Weekly Vitals    [place] 26-42 Years Old, Female Preventive care refers to visits with your health care provider and lifestyle choices that can promote health and wellness. This includes:  A yearly physical exam. This may also be called an annual well check.  Regular dental visits and eye exams.  Immunizations.  Screening for certain conditions.  Healthy lifestyle choices, such as eating a healthy diet, getting regular exercise, not using drugs or products that contain nicotine and tobacco, and limiting alcohol use. What can I expect for my preventive care visit? Physical exam Your health care provider will check your:  Height and weight. This may be used to calculate body mass index (BMI), which tells if you are at a healthy weight.  Heart rate and blood pressure.  Skin for abnormal spots. Counseling Your health care provider may ask you questions about your:  Alcohol, tobacco, and drug use.  Emotional well-being.  Home and relationship well-being.  Sexual activity.  Eating habits.  Work and work Statistician.  Method of birth control.  Menstrual cycle.  Pregnancy history. What immunizations do I need?  Influenza (flu) vaccine  This is recommended every year. Tetanus, diphtheria, and pertussis (Tdap) vaccine  You may need a Td booster every 10 years. Varicella (chickenpox) vaccine  You may need this if you have not been vaccinated. Human papillomavirus (HPV) vaccine  If recommended by your health care provider, you may need three doses over 6 months. Measles, mumps, and rubella (MMR) vaccine  You may need at least one dose of MMR. You may also need a second dose. Meningococcal conjugate (MenACWY) vaccine  One dose is recommended if you are age 6-21 years and a first-year college student living in a residence hall, or if you have one of several medical conditions. You may also need additional  booster doses. Pneumococcal conjugate (PCV13) vaccine  You may need this if you have certain conditions and were not previously vaccinated. Pneumococcal polysaccharide (PPSV23) vaccine  You may need one or two doses if you smoke cigarettes or if you have certain conditions. Hepatitis A vaccine  You may need this if you have certain conditions or if you travel or work in places where you may be exposed to hepatitis A. Hepatitis B vaccine  You may need this if you have certain conditions or if you travel or work in places where you may be exposed to hepatitis B. Haemophilus influenzae type b (Hib) vaccine  You may need this if you have certain conditions. You may receive vaccines as individual doses or as more than one vaccine together in one shot (combination vaccines). Talk with your health care provider about the risks and benefits of combination vaccines. What tests do I need?  Blood tests  Lipid and cholesterol levels. These may be checked every 5 years starting at age 36.  Hepatitis C test.  Hepatitis B test. Screening  Diabetes screening. This is done by checking your blood sugar (glucose) after you have not eaten for a while (fasting).  Sexually transmitted disease (STD) testing.  BRCA-related cancer screening. This may be done if you have a family history of breast, ovarian, tubal, or peritoneal cancers.  Pelvic exam and Pap test. This may be done every 3 years starting at age 21. Starting at age 81, this may be done every 5 years if you have a Pap test in combination with an HPV test. Talk with your health care provider about your  test results, treatment options, and if necessary, the need for more tests. Follow these instructions at home: Eating and drinking   Eat a diet that includes fresh fruits and vegetables, whole grains, lean protein, and low-fat dairy.  Take vitamin and mineral supplements as recommended by your health care provider.  Do not drink alcohol  if: ? Your health care provider tells you not to drink. ? You are pregnant, may be pregnant, or are planning to become pregnant.  If you drink alcohol: ? Limit how much you have to 0-1 drink a day. ? Be aware of how much alcohol is in your drink. In the [place] one drink equals one 12 oz bottle of beer (355 mL), one 5 oz glass of wine (148 mL), or one 1 oz glass of hard liquor (44 mL). Lifestyle  Take daily care of your teeth and gums.  Stay active. Exercise for at least 30 minutes on 5 or more days each week.  Do not use any products that contain nicotine or tobacco, such as cigarettes, e-cigarettes, and chewing tobacco. If you need help quitting, ask your health care provider.  If you are sexually active, practice safe sex. Use a condom or other form of birth control (contraception) in order to prevent pregnancy and STIs (sexually transmitted infections). If you plan to become pregnant, see your health care provider for a preconception visit. What's next?  Visit your health care provider once a year for a well check visit.  Ask your health care provider how often you should have your eyes and teeth checked.  Stay up to date on all vaccines. This information is not intended to replace advice given to you by your health care provider. Make sure you discuss any questions you have with your health care provider. Document Released: 08/01/2001 Document Revised: 02/14/2018 Document Reviewed: 02/14/2018 Elsevier Patient Education  2020 [employer].

## 2019-02-11 NOTE — Progress Notes (Signed)
   Subjective:    Patient ID: Nancy Higgins, female    DOB: 1975-10-01, 43 y.o.   MRN: [id number]  HPI  Patient here for annual exam. In July she  donated her left kidney to her husband and ended up losing a lot of blood and developing a hematoma. Received 2 units of blood during her hospitalization. She is being followed by [facility]. She is concerned about itching and redness at her incisions.   She reports taking appropriate precautions during pandemic. She is walking every day and eating a balanced diet.   LMP: 02/07/19, reports regular periods   Review of Systems  Constitutional: Negative for chills and fever.  HENT: Negative for congestion and ear pain.   Eyes: Negative for pain and visual disturbance.  Respiratory: Negative for chest tightness and shortness of breath.   Cardiovascular: Negative for chest pain.  Gastrointestinal: Negative for constipation.  Genitourinary: Negative for dysuria, flank pain and hematuria.       Lower abd/suprapubic surgical incision throbbing, intermittent, 6/10 at worst, improves with ice packs and gabapentin prescribed by [facility]  Musculoskeletal: Negative for arthralgias and back pain.  Skin: Positive for rash.       surgical incisions that itch and appear red, like an allergic reaction  Neurological: Negative for dizziness, light-headedness and headaches.  Psychiatric/Behavioral: Negative for behavioral problems.       Objective:   Physical Exam Constitutional:      Appearance: Normal appearance.  HENT:     Head: Normocephalic and atraumatic.     Right Ear: Tympanic membrane normal.     Left Ear: Tympanic membrane normal.  Eyes:     Extraocular Movements: Extraocular movements intact.  Neck:     Musculoskeletal: Normal range of motion.  Cardiovascular:     Rate and Rhythm: Normal rate and regular rhythm.     Pulses: Normal pulses.     Heart sounds: Normal heart sounds.  Pulmonary:     Effort: Pulmonary effort is normal.   Breath sounds: Normal breath sounds.  Abdominal:     Palpations: Abdomen is soft.     Comments: Tenderness at surgical incisions  Musculoskeletal: Normal range of motion.  Skin:    General: Skin is warm and dry.     Capillary Refill: Capillary refill takes less than 2 seconds.     Comments: Mild dermatitis to lower abdomen/suprapubic regions surrounding surgical incisions.  Neurological:     General: No focal deficit present.     Mental Status: She is alert and oriented to person, place, and time.  Psychiatric:        Mood and Affect: Mood normal.        Behavior: Behavior normal.        Thought Content: Thought content normal.        Judgment: Judgment normal.    Vitals:   02/11/19 1119  BP: 98/72  Pulse: 60  Resp: 18  Temp: 98.2 F (36.8 C)  SpO2: 98%         Assessment & Plan:  Patient encouraged to continue taking appropriate precautions related to pandemic. Safety and health promotion discussed. Labs ordered. Prescriptions given for zyrtec and pepcid to help with rash. Patient encouraged to try supportive measures including witch hazel for itching. She will notify us if rash worsens or does not begin improving. Follow-up as needed.

## 2019-02-16 DIAGNOSIS — Z524 Kidney donor: Secondary | ICD-10-CM | POA: Insufficient documentation

## 2019-02-16 NOTE — Assessment & Plan Note (Signed)
She has donated a kidney to her husband and is recovering well. She has some discomfort at the incision site but it has been improving. Incision site looks good.

## 2019-02-16 NOTE — Assessment & Plan Note (Signed)
Patient encouraged to maintain heart healthy diet, regular exercise, adequate sleep. Consider daily probiotics. Take medications as prescribed 

## 2019-02-16 NOTE — Progress Notes (Signed)
Patient ID: Nancy Higgins, female   DOB: July 28, 1975, 43 y.o.   MRN: [id number]   Subjective:    Patient ID: Nancy Higgins, female    DOB: 08-01-1975, 43 y.o.   MRN: [id number]  Chief Complaint  Patient presents with  . Annual Exam    HPI Patient is in today for annual preventative exam. She is doing well and recovering from her surgery in July when she donated a kidney to her husband. She still notes some pain at thei incision site but it is improving. She had trouble with constipation in the beginning but it has resolved now. She is maintaining a heart healthy diet and tries to stay active. Denies CP/palp/SOB/HA/congestion/fevers/GI or GU c/o. Taking meds as prescribed PMH: see history in EPIC  Past Surgical History:  Procedure Laterality Date  . CYSTOSCOPY W/ URETERAL STENT PLACEMENT     and removal  . KIDNEY DONATION Left 12/2018  . LAPAROSCOPIC APPENDECTOMY  12/30/2011   Procedure: APPENDECTOMY LAPAROSCOPIC;  Surgeon: Edward Jolly, MD;  Location: [facility];  Service: General;  Laterality: N/A;  . LITHOTRIPSY     once during pregnancy    Family History  Problem Relation Age of Onset  . Thyroid disease Mother   . Other Mother        uterine prolapse  . Scoliosis Mother   . Thyroid disease Father   . Thyroid disease Brother   . COPD Maternal Grandmother   . Colon cancer Maternal Grandfather   . Scoliosis Sister   . Scoliosis Sister   . Other Daughter        papilledema  . Other Maternal Aunt        fungal meningitis, leukocytopenia  . Breast cancer Neg Hx     Social History   Socioeconomic History  . Marital status: Married    Spouse name: Not on file  . Number of children: 2  . Years of education: Not on file  . Highest education level: Not on file  Occupational History  . Occupation: Glass blower/designer   Social Needs  . Financial resource strain: Not on file  . Food insecurity    Worry: Not on file    Inability: Not on file  .  Transportation needs    Medical: Not on file    Non-medical: Not on file  Tobacco Use  . Smoking status: Former Research scientist (life sciences)  . Smokeless tobacco: Never Used  Substance and Sexual Activity  . Alcohol use: Yes    Comment: occasional  . Drug use: No  . Sexual activity: Not on file    Comment: lives with husband and daughters, no dietary restrictions  Lifestyle  . Physical activity    Days per week: Not on file    Minutes per session: Not on file  . Stress: Not on file  Relationships  . Social Herbalist on phone: Not on file    Gets together: Not on file    Attends religious service: Not on file    Active member of club or organization: Not on file    Attends meetings of clubs or organizations: Not on file    Relationship status: Not on file  . Intimate partner violence    Fear of current or ex partner: Not on file    Emotionally abused: Not on file    Physically abused: Not on file    Forced sexual activity: Not on file  Other Topics Concern  . Not on  file  Social History Narrative  . Not on file    Outpatient Medications Prior to Visit  Medication Sig Dispense Refill  . ALPRAZolam (XANAX) 0.25 MG tablet Take 1 tablet (0.25 mg total) by mouth 2 (two) times daily as needed for anxiety. 5 tablet 1  . ibuprofen (ADVIL,MOTRIN) 200 MG tablet Take 400 mg by mouth every 6 (six) hours as needed. Patient took this medication for menstrual cramps.    Marland Kitchen levothyroxine (SYNTHROID) 25 MCG tablet TAKE 1 TABLET BY MOUTH EVERY DAY BEFORE BREAKFAST 90 tablet 1   No facility-administered medications prior to visit.     No Known Allergies  Review of Systems  Constitutional: Negative for chills, fever and malaise/fatigue.  HENT: Negative for congestion and hearing loss.   Eyes: Negative for discharge.  Respiratory: Negative for cough, sputum production and shortness of breath.   Cardiovascular: Negative for chest pain, palpitations and leg swelling.  Gastrointestinal: Positive for  abdominal pain. Negative for blood in stool, constipation, diarrhea, heartburn, nausea and vomiting.  Genitourinary: Negative for dysuria, frequency, hematuria and urgency.  Musculoskeletal: Negative for back pain, falls and myalgias.  Skin: Negative for rash.  Neurological: Negative for dizziness, sensory change, loss of consciousness, weakness and headaches.  Endo/Heme/Allergies: Negative for environmental allergies. Does not bruise/bleed easily.  Psychiatric/Behavioral: Negative for depression and suicidal ideas. The patient is not nervous/anxious and does not have insomnia.        Objective:    Physical Exam Constitutional:      General: She is not in acute distress.    Appearance: She is not diaphoretic.  HENT:     Head: Normocephalic and atraumatic.     Right Ear: External ear normal.     Left Ear: External ear normal.     Nose: Nose normal.     Mouth/Throat:     Pharynx: No oropharyngeal exudate.  Eyes:     General: No scleral icterus.       Right eye: No discharge.        Left eye: No discharge.     Conjunctiva/sclera: Conjunctivae normal.     Pupils: Pupils are equal, round, and reactive to light.  Neck:     Musculoskeletal: Normal range of motion and neck supple.     Thyroid: No thyromegaly.  Cardiovascular:     Rate and Rhythm: Normal rate and regular rhythm.     Heart sounds: Normal heart sounds. No murmur.  Pulmonary:     Effort: Pulmonary effort is normal. No respiratory distress.     Breath sounds: Normal breath sounds. No wheezing or rales.  Abdominal:     General: Bowel sounds are normal. There is no distension.     Palpations: Abdomen is soft. There is no mass.     Tenderness: There is no abdominal tenderness. There is no guarding.     Comments: Healing incision sites noted. No surrounding fluctuance or erythema.   Musculoskeletal: Normal range of motion.        General: No tenderness.  Lymphadenopathy:     Cervical: No cervical adenopathy.  Skin:     General: Skin is warm and dry.     Findings: No rash.  Neurological:     Mental Status: She is alert and oriented to person, place, and time.     Cranial Nerves: No cranial nerve deficit.     Coordination: Coordination normal.     Deep Tendon Reflexes: Reflexes are normal and symmetric. Reflexes normal.  BP 98/72 (BP Location: Left Arm, Patient Position: Sitting, Cuff Size: Normal)   Pulse 60   Temp 98.2 F (36.8 C) (Temporal)   Resp 18   Ht 5\' 2"  (1.575 m)   Wt 103 lb (46.7 kg)   SpO2 98%   BMI 18.84 kg/m  Wt Readings from Last 3 Encounters:  02/11/19 103 lb (46.7 kg)  02/04/18 105 lb 12.8 oz (48 kg)  02/01/17 100 lb 12.8 oz (45.7 kg)    Diabetic Foot Exam - Simple   No data filed     Lab Results  Component Value Date   WBC 5.9 02/11/2019   HGB 12.4 02/11/2019   HCT 37.5 02/11/2019   PLT 206.0 02/11/2019   GLUCOSE 86 02/11/2019   CHOL 210 (H) 02/11/2019   TRIG 87.0 02/11/2019   HDL 71.70 02/11/2019   LDLCALC 121 (H) 02/11/2019   ALT 13 02/11/2019   AST 16 02/11/2019   NA 138 02/11/2019   K 3.9 02/11/2019   CL 103 02/11/2019   CREATININE 1.10 02/11/2019   BUN 12 02/11/2019   CO2 26 02/11/2019   TSH 2.81 02/11/2019    Lab Results  Component Value Date   TSH 2.81 02/11/2019   Lab Results  Component Value Date   WBC 5.9 02/11/2019   HGB 12.4 02/11/2019   HCT 37.5 02/11/2019   MCV 94.1 02/11/2019   PLT 206.0 02/11/2019   Lab Results  Component Value Date   NA 138 02/11/2019   K 3.9 02/11/2019   CO2 26 02/11/2019   GLUCOSE 86 02/11/2019   BUN 12 02/11/2019   CREATININE 1.10 02/11/2019   BILITOT 0.5 02/11/2019   ALKPHOS 48 02/11/2019   AST 16 02/11/2019   ALT 13 02/11/2019   PROT 7.0 02/11/2019   ALBUMIN 4.7 02/11/2019   CALCIUM 9.4 02/11/2019   GFR 54.56 (L) 02/11/2019   Lab Results  Component Value Date   CHOL 210 (H) 02/11/2019   Lab Results  Component Value Date   HDL 71.70 02/11/2019   Lab Results  Component Value Date    LDLCALC 121 (H) 02/11/2019   Lab Results  Component Value Date   TRIG 87.0 02/11/2019   Lab Results  Component Value Date   CHOLHDL 3 02/11/2019   No results found for: HGBA1C     Assessment & Plan:   Problem List Items Addressed This Visit    Preventative health care (Chronic)    Patient encouraged to maintain heart healthy diet, regular exercise, adequate sleep. Consider daily probiotics. Take medications as prescribed      Relevant Orders   Ferritin (Completed)   Abnormal TSH   Relevant Orders   CBC (Completed)   Comprehensive metabolic panel (Completed)   TSH (Completed)   Hyperlipidemia, mixed    Encouraged heart healthy diet, increase exercise, avoid trans fats and simple carbs      Relevant Orders   CBC (Completed)   Comprehensive metabolic panel (Completed)   Lipid panel (Completed)   Donor of kidney for transplant    She has donated a kidney to her husband and is recovering well. She has some discomfort at the incision site but it has been improving. Incision site looks good.        Other Visit Diagnoses    Needs flu shot    -  Primary   Relevant Orders   Flu Vaccine QUAD 6+ mos PF IM (Fluarix Quad PF) (Completed)   Anemia, unspecified type  Relevant Orders   Ferritin (Completed)      I am having Tahira B. Luba start on cetirizine and famotidine. I am also having her maintain her ibuprofen, ALPRAZolam, and levothyroxine.  Meds ordered this encounter  Medications  . cetirizine (ZYRTEC) 10 MG tablet    Sig: Take 1 tablet (10 mg total) by mouth 2 (two) times daily as needed for allergies.    Dispense:  60 tablet    Refill:  1  . famotidine (PEPCID) 20 MG tablet    Sig: Take 1 tablet (20 mg total) by mouth 2 (two) times daily as needed for heartburn or indigestion.    Dispense:  60 tablet    Refill:  1     Penni Homans, MD

## 2019-02-16 NOTE — Assessment & Plan Note (Signed)
Encouraged heart healthy diet, increase exercise, avoid trans fats and simple carbs.  

## 2019-02-17 ENCOUNTER — Encounter: Payer: BLUE CROSS/BLUE SHIELD | Admitting: Family Medicine

## 2019-04-24 ENCOUNTER — Other Ambulatory Visit: Payer: Self-pay | Admitting: Family Medicine

## 2019-05-07 DIAGNOSIS — J029 Acute pharyngitis, unspecified: Secondary | ICD-10-CM | POA: Diagnosis not present

## 2019-06-07 ENCOUNTER — Other Ambulatory Visit: Payer: Self-pay | Admitting: Family Medicine

## 2019-06-16 ENCOUNTER — Other Ambulatory Visit: Payer: Self-pay | Admitting: Family Medicine

## 2019-12-20 ENCOUNTER — Other Ambulatory Visit: Payer: Self-pay | Admitting: Family Medicine

## 2020-01-30 ENCOUNTER — Other Ambulatory Visit: Payer: Self-pay | Admitting: Family Medicine

## 2020-01-30 NOTE — Telephone Encounter (Signed)
Will receive greater qualities at follow up appointment

## 2020-02-12 ENCOUNTER — Encounter: Payer: BC Managed Care – PPO | Admitting: Family Medicine

## 2020-02-13 ENCOUNTER — Other Ambulatory Visit: Payer: Self-pay | Admitting: Family Medicine

## 2020-03-23 ENCOUNTER — Other Ambulatory Visit: Payer: Self-pay | Admitting: Family Medicine

## 2020-07-10 ENCOUNTER — Other Ambulatory Visit: Payer: Self-pay | Admitting: Family Medicine

## 2020-08-05 ENCOUNTER — Other Ambulatory Visit: Payer: Self-pay

## 2020-08-05 ENCOUNTER — Ambulatory Visit (INDEPENDENT_AMBULATORY_CARE_PROVIDER_SITE_OTHER): Payer: No Typology Code available for payment source | Admitting: Family Medicine

## 2020-08-05 ENCOUNTER — Other Ambulatory Visit (HOSPITAL_COMMUNITY)
Admission: RE | Admit: 2020-08-05 | Discharge: 2020-08-05 | Disposition: A | Discharge status: Home / Self Care | Payer: No Typology Code available for payment source | Source: Ambulatory Visit | Attending: Family Medicine | Admitting: Family Medicine

## 2020-08-05 ENCOUNTER — Encounter: Payer: Self-pay | Admitting: Family Medicine

## 2020-08-05 VITALS — BP 102/66 | HR 69 | Temp 98.3°F | Resp 16 | Ht 62.0 in | Wt 106.6 lb

## 2020-08-05 DIAGNOSIS — Z0000 Encounter for general adult medical examination without abnormal findings: Secondary | ICD-10-CM

## 2020-08-05 DIAGNOSIS — K219 Gastro-esophageal reflux disease without esophagitis: Secondary | ICD-10-CM

## 2020-08-05 DIAGNOSIS — Z124 Encounter for screening for malignant neoplasm of cervix: Secondary | ICD-10-CM

## 2020-08-05 DIAGNOSIS — R7989 Other specified abnormal findings of blood chemistry: Secondary | ICD-10-CM

## 2020-08-05 DIAGNOSIS — Z1239 Encounter for other screening for malignant neoplasm of breast: Secondary | ICD-10-CM | POA: Diagnosis not present

## 2020-08-05 DIAGNOSIS — Z87440 Personal history of urinary (tract) infections: Secondary | ICD-10-CM

## 2020-08-05 DIAGNOSIS — E782 Mixed hyperlipidemia: Secondary | ICD-10-CM

## 2020-08-05 DIAGNOSIS — Z524 Kidney donor: Secondary | ICD-10-CM

## 2020-08-05 DIAGNOSIS — E039 Hypothyroidism, unspecified: Secondary | ICD-10-CM

## 2020-08-05 NOTE — Assessment & Plan Note (Signed)
Pap today, no concerns on exam.  

## 2020-08-05 NOTE — Patient Instructions (Signed)
Preventive Care 21-44 Years Old, Female Preventive care refers to lifestyle choices and visits with your health care provider that can promote health and wellness. This includes:  A yearly physical exam. This is also called an annual wellness visit.  Regular dental and eye exams.  Immunizations.  Screening for certain conditions.  Healthy lifestyle choices, such as: ? Eating a healthy diet. ? Getting regular exercise. ? Not using drugs or products that contain nicotine and tobacco. ? Limiting alcohol use. What can I expect for my preventive care visit? Physical exam Your health care provider may check your:  Height and weight. These may be used to calculate your BMI (body mass index). BMI is a measurement that tells if you are at a healthy weight.  Heart rate and blood pressure.  Body temperature.  Skin for abnormal spots. Counseling Your health care provider may ask you questions about your:  Past medical problems.  Family's medical history.  Alcohol, tobacco, and drug use.  Emotional well-being.  Home life and relationship well-being.  Sexual activity.  Diet, exercise, and sleep habits.  Work and work environment.  Access to firearms.  Method of birth control.  Menstrual cycle.  Pregnancy history. What immunizations do I need? Vaccines are usually given at various ages, according to a schedule. Your health care provider will recommend vaccines for you based on your age, medical history, and lifestyle or other factors, such as travel or where you work.   What tests do I need? Blood tests  Lipid and cholesterol levels. These may be checked every 5 years starting at age 20.  Hepatitis C test.  Hepatitis B test. Screening  Diabetes screening. This is done by checking your blood sugar (glucose) after you have not eaten for a while (fasting).  STD (sexually transmitted disease) testing, if you are at risk.  BRCA-related cancer screening. This may be  done if you have a family history of breast, ovarian, tubal, or peritoneal cancers.  Pelvic exam and Pap test. This may be done every 3 years starting at age 21. Starting at age 30, this may be done every 5 years if you have a Pap test in combination with an HPV test. Talk with your health care provider about your test results, treatment options, and if necessary, the need for more tests.   Follow these instructions at home: Eating and drinking  Eat a healthy diet that includes fresh fruits and vegetables, whole grains, lean protein, and low-fat dairy products.  Take vitamin and mineral supplements as recommended by your health care provider.  Do not drink alcohol if: ? Your health care provider tells you not to drink. ? You are pregnant, may be pregnant, or are planning to become pregnant.  If you drink alcohol: ? Limit how much you have to 0-1 drink a day. ? Be aware of how much alcohol is in your drink. In the [place] one drink equals one 12 oz bottle of beer (355 mL), one 5 oz glass of wine (148 mL), or one 1 oz glass of hard liquor (44 mL).   Lifestyle  Take daily care of your teeth and gums. Brush your teeth every morning and night with fluoride toothpaste. Floss one time each day.  Stay active. Exercise for at least 30 minutes 5 or more days each week.  Do not use any products that contain nicotine or tobacco, such as cigarettes, e-cigarettes, and chewing tobacco. If you need help quitting, ask your health care provider.  Do not   use drugs.  If you are sexually active, practice safe sex. Use a condom or other form of protection to prevent STIs (sexually transmitted infections).  If you do not wish to become pregnant, use a form of birth control. If you plan to become pregnant, see your health care provider for a prepregnancy visit.  Find healthy ways to cope with stress, such as: ? Meditation, yoga, or listening to music. ? Journaling. ? Talking to a trusted  person. ? Spending time with friends and family. Safety  Always wear your seat belt while driving or riding in a vehicle.  Do not drive: ? If you have been drinking alcohol. Do not ride with someone who has been drinking. ? When you are tired or distracted. ? While texting.  Wear a helmet and other protective equipment during sports activities.  If you have firearms in your house, make sure you follow all gun safety procedures.  Seek help if you have been physically or sexually abused. What's next?  Go to your health care provider once a year for an annual wellness visit.  Ask your health care provider how often you should have your eyes and teeth checked.  Stay up to date on all vaccines. This information is not intended to replace advice given to you by your health care provider. Make sure you discuss any questions you have with your health care provider. Document Revised: 02/01/2020 Document Reviewed: 02/14/2018 Elsevier Patient Education  2021 [employer]  

## 2020-08-06 ENCOUNTER — Encounter: Payer: Self-pay | Admitting: Family Medicine

## 2020-08-06 ENCOUNTER — Other Ambulatory Visit: Payer: Self-pay

## 2020-08-06 LAB — COMPREHENSIVE METABOLIC PANEL
ALT: 8 U/L (ref 0–35)
AST: 13 U/L (ref 0–37)
Albumin: 4.4 g/dL (ref 3.5–5.2)
Alkaline Phosphatase: 45 U/L (ref 39–117)
BUN: 11 mg/dL (ref 6–23)
CO2: 27 mEq/L (ref 19–32)
Calcium: 9.2 mg/dL (ref 8.4–10.5)
Chloride: 102 mEq/L (ref 96–112)
Creatinine, Ser: 1.09 mg/dL (ref 0.40–1.20)
GFR: 62.38 mL/min (ref >60.00)
Glucose, Bld: 76 mg/dL (ref 70–99)
Potassium: 4 mEq/L (ref 3.5–5.1)
Sodium: 137 mEq/L (ref 135–145)
Total Bilirubin: 0.6 mg/dL (ref 0.2–1.2)
Total Protein: 6.9 g/dL (ref 6.0–8.3)

## 2020-08-06 LAB — LIPID PANEL
Cholesterol: 206 mg/dL — ABNORMAL HIGH (ref 0–200)
HDL: 93 mg/dL (ref >39.00)
LDL Cholesterol: 98 mg/dL (ref 0–99)
NonHDL: 112.74
Total CHOL/HDL Ratio: 2
Triglycerides: 73 mg/dL (ref 0.0–149.0)
VLDL: 14.6 mg/dL (ref 0.0–40.0)

## 2020-08-06 LAB — CYTOLOGY - PAP: Diagnosis: NEGATIVE

## 2020-08-06 LAB — CBC WITH DIFFERENTIAL/PLATELET
Basophils Absolute: 0.1 10*3/uL (ref 0.0–0.1)
Basophils Relative: 1.1 % (ref 0.0–3.0)
Eosinophils Absolute: 0.6 10*3/uL (ref 0.0–0.7)
Eosinophils Relative: 10.2 % — ABNORMAL HIGH (ref 0.0–5.0)
HCT: 38.7 % (ref 36.0–46.0)
Hemoglobin: 13.1 g/dL (ref 12.0–15.0)
Lymphocytes Relative: 31 % (ref 12.0–46.0)
Lymphs Abs: 1.8 10*3/uL (ref 0.7–4.0)
MCHC: 33.8 g/dL (ref 30.0–36.0)
MCV: 93.7 fl (ref 78.0–100.0)
Monocytes Absolute: 0.5 10*3/uL (ref 0.1–1.0)
Monocytes Relative: 7.8 % (ref 3.0–12.0)
Neutro Abs: 3 10*3/uL (ref 1.4–7.7)
Neutrophils Relative %: 49.9 % (ref 43.0–77.0)
Platelets: 251 10*3/uL (ref 150.0–400.0)
RBC: 4.13 Mil/uL (ref 3.87–5.11)
RDW: 12.6 % (ref 11.5–15.5)
WBC: 5.9 10*3/uL (ref 4.0–10.5)

## 2020-08-06 LAB — TSH: TSH: 4.68 u[IU]/mL — ABNORMAL HIGH (ref 0.35–4.50)

## 2020-08-06 MED ORDER — LEVOTHYROXINE SODIUM 50 MCG PO TABS: 50.0000 ug | ORAL_TABLET | Freq: Every day | ORAL | 1 refills | Status: DC

## 2020-08-06 NOTE — Assessment & Plan Note (Signed)
Patient encouraged to maintain heart healthy diet, regular exercise, adequate sleep. Consider daily probiotics. Take medications as prescribed. Labs reviewed and ordered. Screening MGM ordered. Pap completed.

## 2020-08-06 NOTE — Assessment & Plan Note (Signed)
She continues to hydrate well and avoid OTC supplements without consultation

## 2020-08-06 NOTE — Assessment & Plan Note (Signed)
On Levothyroxine, continue to monitor 

## 2020-08-06 NOTE — Assessment & Plan Note (Signed)
No significant flares, no need for medications at this time.

## 2020-08-06 NOTE — Assessment & Plan Note (Signed)
Mild. Encouraged heart healthy diet, increase exercise, avoid trans fats, consider a krill oil cap daily

## 2020-08-06 NOTE — Assessment & Plan Note (Signed)
If she has mild symptoms she has been advised she can contact the office for an order to come give a urine sample so we can consider treatment.

## 2020-08-06 NOTE — Progress Notes (Signed)
Patient ID: Nancy Higgins, female   DOB: 1975/12/23, 45 y.o.   MRN: [id number]   Subjective:    Patient ID: Nancy Higgins, female    DOB: 1976-05-02, 45 y.o.   MRN: [id number]  Chief Complaint  Patient presents with  . Annual Exam    HPI Patient is in today for annual preventative exam and follow up on chronic medical concerns. No recent febrile illness or hospitalizations. She is feeling well and tries to stay active and maintain a heart healthy diet. Her children are now 49 and 17 and while the pandemic has been hard they are doing OK. She denies any acute or GYN concerns. Has tolerated her COVID vaccines and continues to mask. Denies CP/palp/SOB/HA/congestion/fevers/GI or GU c/o. Taking meds as prescribed  Past Medical History:  Diagnosis Date  . Abnormal TSH 01/10/2015  . Allergy   . Anemia    with pregnancy  . Anxiety   . Anxiety and depression   . Cervical cancer screening 10/14/2013   Menarche at 13 Regular and moderate flow, dysmenorrhea intermittently no history of abnormal pap in past G2P2, s/p 2 svd No history of MGM concerns today include increased vaginal discharge increased over past few months, no odor, clear Last pap 2 1/2 years ago, normal, no concerns LMP 10/03/13 No gyn surgeries   . Constipation   . Constipation 05/22/2012  . GERD (gastroesophageal reflux disease)   . History of renal stone   . History of tobacco abuse 12/29/2014   Last cigarette in January 2016 Used patch   . Hyperlipidemia, mixed 02/06/2016  . LOM (left otitis media) 05/24/2012  . Preventative health care 10/19/2013  . Reflux 05/22/2012  . Sinusitis 05/22/2012    Past Surgical History:  Procedure Laterality Date  . CYSTOSCOPY W/ URETERAL STENT PLACEMENT     and removal  . KIDNEY DONATION Left 12/2018  . LAPAROSCOPIC APPENDECTOMY  12/30/2011   Procedure: APPENDECTOMY LAPAROSCOPIC;  Surgeon: Edward Jolly, MD;  Location: [facility];  Service: General;  Laterality: N/A;   . LITHOTRIPSY     once during pregnancy    Family History  Problem Relation Age of Onset  . Thyroid disease Mother   . Other Mother        uterine prolapse  . Scoliosis Mother   . Thyroid disease Father   . Thyroid disease Brother   . COPD Maternal Grandmother   . Colon cancer Maternal Grandfather   . Scoliosis Sister   . Scoliosis Sister   . Other Daughter        papilledema  . Other Maternal Aunt        fungal meningitis, leukocytopenia  . Breast cancer Neg Hx     Social History   Socioeconomic History  . Marital status: Married    Spouse name: Not on file  . Number of children: 2  . Years of education: Not on file  . Highest education level: Not on file  Occupational History  . Occupation: Glass blower/designer   Tobacco Use  . Smoking status: Former Research scientist (life sciences)  . Smokeless tobacco: Never Used  Vaping Use  . Vaping Use: Never used  Substance and Sexual Activity  . Alcohol use: Yes    Comment: occasional  . Drug use: No  . Sexual activity: Not on file    Comment: lives with husband and daughters, no dietary restrictions  Other Topics Concern  . Not on file  Social History Narrative  . Not on file  Social Determinants of Health   Financial Resource Strain: Not on file  Food Insecurity: Not on file  Transportation Needs: Not on file  Physical Activity: Not on file  Stress: Not on file  Social Connections: Not on file  Intimate Partner Violence: Not on file    Outpatient Medications Prior to Visit  Medication Sig Dispense Refill  . levothyroxine (SYNTHROID) 25 MCG tablet Take 1 tablet (25 mcg total) by mouth daily before breakfast. 90 tablet 1  . ALPRAZolam (XANAX) 0.25 MG tablet Take 1 tablet (0.25 mg total) by mouth 2 (two) times daily as needed for anxiety. 5 tablet 1  . cetirizine (ZYRTEC) 10 MG tablet TAKE 1 TABLET (10 MG TOTAL) BY MOUTH 2 (TWO) TIMES DAILY AS NEEDED FOR ALLERGIES. 60 tablet 1  . famotidine (PEPCID) 20 MG tablet TAKE 1 TABLET (20 MG TOTAL)  BY MOUTH 2 (TWO) TIMES DAILY AS NEEDED FOR HEARTBURN OR INDIGESTION. 30 tablet 0  . ibuprofen (ADVIL,MOTRIN) 200 MG tablet Take 400 mg by mouth every 6 (six) hours as needed. Patient took this medication for menstrual cramps.     No facility-administered medications prior to visit.    No Known Allergies  Review of Systems  Constitutional: Negative for fever.  HENT: Negative for congestion.   Eyes: Negative for blurred vision.  Respiratory: Negative for cough.   Cardiovascular: Negative for chest pain and palpitations.  Gastrointestinal: Negative for vomiting.  Musculoskeletal: Negative for back pain.  Skin: Negative for rash.  Neurological: Negative for loss of consciousness and headaches.       Objective:    Physical Exam Constitutional:      General: She is not in acute distress.    Appearance: She is well-developed and well-nourished.  HENT:     Head: Normocephalic and atraumatic.  Eyes:     Conjunctiva/sclera: Conjunctivae normal.  Neck:     Thyroid: No thyromegaly.  Cardiovascular:     Rate and Rhythm: Normal rate and regular rhythm.     Heart sounds: Normal heart sounds. No murmur heard.   Pulmonary:     Effort: Pulmonary effort is normal. No respiratory distress.     Breath sounds: Normal breath sounds.  Abdominal:     General: Bowel sounds are normal. There is no distension.     Palpations: Abdomen is soft. There is no mass.     Tenderness: There is no abdominal tenderness.  Genitourinary:    General: Normal vulva.     Vagina: Vaginal discharge present.     Rectum: Normal.     Comments: Discharge clear to whitish. Cervix without lesions. Slight blood after pap Musculoskeletal:        General: No edema.     Cervical back: Neck supple.  Lymphadenopathy:     Cervical: No cervical adenopathy.  Skin:    General: Skin is warm and dry.  Neurological:     Mental Status: She is alert and oriented to person, place, and time.  Psychiatric:        Mood and  Affect: Mood and affect normal.        Behavior: Behavior normal.     BP 102/66   Pulse 69   Temp 98.3 F (36.8 C)   Resp 16   Ht 5\' 2"  (1.575 m)   Wt 106 lb 9.6 oz (48.4 kg)   SpO2 99%   BMI 19.50 kg/m  Wt Readings from Last 3 Encounters:  08/05/20 106 lb 9.6 oz (48.4 kg)  02/11/19 103  lb (46.7 kg)  02/04/18 105 lb 12.8 oz (48 kg)    Diabetic Foot Exam - Simple   No data filed    Lab Results  Component Value Date   WBC 5.9 02/11/2019   HGB 12.4 02/11/2019   HCT 37.5 02/11/2019   PLT 206.0 02/11/2019   GLUCOSE 86 02/11/2019   CHOL 210 (H) 02/11/2019   TRIG 87.0 02/11/2019   HDL 71.70 02/11/2019   LDLCALC 121 (H) 02/11/2019   ALT 13 02/11/2019   AST 16 02/11/2019   NA 138 02/11/2019   K 3.9 02/11/2019   CL 103 02/11/2019   CREATININE 1.10 02/11/2019   BUN 12 02/11/2019   CO2 26 02/11/2019   TSH 2.81 02/11/2019    Lab Results  Component Value Date   TSH 2.81 02/11/2019   Lab Results  Component Value Date   WBC 5.9 02/11/2019   HGB 12.4 02/11/2019   HCT 37.5 02/11/2019   MCV 94.1 02/11/2019   PLT 206.0 02/11/2019   Lab Results  Component Value Date   NA 138 02/11/2019   K 3.9 02/11/2019   CO2 26 02/11/2019   GLUCOSE 86 02/11/2019   BUN 12 02/11/2019   CREATININE 1.10 02/11/2019   BILITOT 0.5 02/11/2019   ALKPHOS 48 02/11/2019   AST 16 02/11/2019   ALT 13 02/11/2019   PROT 7.0 02/11/2019   ALBUMIN 4.7 02/11/2019   CALCIUM 9.4 02/11/2019   GFR 54.56 (L) 02/11/2019   Lab Results  Component Value Date   CHOL 210 (H) 02/11/2019   Lab Results  Component Value Date   HDL 71.70 02/11/2019   Lab Results  Component Value Date   LDLCALC 121 (H) 02/11/2019   Lab Results  Component Value Date   TRIG 87.0 02/11/2019   Lab Results  Component Value Date   CHOLHDL 3 02/11/2019   No results found for: HGBA1C     Assessment & Plan:   Problem List Items Addressed This Visit    History of UTI    If she has mild symptoms she has been  advised she can contact the office for an order to come give a urine sample so we can consider treatment.      Preventative health care (Chronic)    Patient encouraged to maintain heart healthy diet, regular exercise, adequate sleep. Consider daily probiotics. Take medications as prescribed. Labs reviewed and ordered. Screening MGM ordered. Pap completed.       Relevant Orders   CBC with Differential/Platelet   Comprehensive metabolic panel   Lipid panel   GERD (gastroesophageal reflux disease)    No significant flares, no need for medications at this time.      Cervical cancer screening    Pap today, no concerns on exam.       Relevant Orders   Cytology - PAP( [place])   Hypothyroid    On Levothyroxine, continue to monitor      Hyperlipidemia, mixed    Mild. Encouraged heart healthy diet, increase exercise, avoid trans fats, consider a krill oil cap daily      Donor of kidney for transplant    She continues to hydrate well and avoid OTC supplements without consultation       Other Visit Diagnoses    Encounter for screening for malignant neoplasm of breast, unspecified screening modality    -  Primary   Relevant Orders   MM 3D SCREEN BREAST BILATERAL      I have discontinued Janett Billow  B. Stansbury's ibuprofen, ALPRAZolam, cetirizine, and famotidine. I am also having her maintain her levothyroxine.  No orders of the defined types were placed in this encounter.    Penni Homans, MD

## 2020-09-24 ENCOUNTER — Inpatient Hospital Stay: Admission: RE | Admit: 2020-09-24 | Payer: No Typology Code available for payment source | Source: Ambulatory Visit

## 2020-10-29 ENCOUNTER — Other Ambulatory Visit: Payer: Self-pay | Admitting: *Deleted

## 2020-10-29 DIAGNOSIS — E039 Hypothyroidism, unspecified: Secondary | ICD-10-CM

## 2020-10-29 NOTE — Progress Notes (Signed)
tsh

## 2020-11-01 ENCOUNTER — Other Ambulatory Visit: Payer: Self-pay

## 2020-11-01 ENCOUNTER — Other Ambulatory Visit (INDEPENDENT_AMBULATORY_CARE_PROVIDER_SITE_OTHER): Payer: No Typology Code available for payment source

## 2020-11-01 DIAGNOSIS — E039 Hypothyroidism, unspecified: Secondary | ICD-10-CM | POA: Diagnosis not present

## 2020-11-01 LAB — TSH: TSH: 0.61 u[IU]/mL (ref 0.35–4.50)

## 2020-11-02 ENCOUNTER — Telehealth: Payer: Self-pay

## 2020-11-02 NOTE — Telephone Encounter (Signed)
Called pt to get scheduled for 3 months lab to recheck TSH.

## 2020-11-12 ENCOUNTER — Other Ambulatory Visit: Payer: Self-pay

## 2020-11-12 ENCOUNTER — Ambulatory Visit
Admission: RE | Admit: 2020-11-12 | Discharge: 2020-11-12 | Disposition: A | Discharge status: Home / Self Care | Payer: No Typology Code available for payment source | Source: Ambulatory Visit | Attending: Family Medicine | Admitting: Family Medicine

## 2020-11-12 DIAGNOSIS — Z1239 Encounter for other screening for malignant neoplasm of breast: Secondary | ICD-10-CM

## 2020-12-13 ENCOUNTER — Other Ambulatory Visit: Payer: Self-pay | Admitting: Family Medicine

## 2020-12-13 ENCOUNTER — Encounter: Payer: Self-pay | Admitting: Family Medicine

## 2020-12-13 MED ORDER — LINACLOTIDE 290 MCG PO CAPS: 290.0000 ug | ORAL_CAPSULE | Freq: Every day | ORAL | 3 refills | Status: DC

## 2020-12-13 NOTE — Telephone Encounter (Addendum)
Patient scheduled for 03/17/21 at 320pm

## 2021-01-04 ENCOUNTER — Other Ambulatory Visit: Payer: Self-pay | Admitting: Family Medicine

## 2021-01-31 ENCOUNTER — Other Ambulatory Visit: Payer: No Typology Code available for payment source

## 2021-02-09 ENCOUNTER — Other Ambulatory Visit: Payer: Self-pay | Admitting: *Deleted

## 2021-02-09 DIAGNOSIS — E039 Hypothyroidism, unspecified: Secondary | ICD-10-CM

## 2021-02-10 ENCOUNTER — Other Ambulatory Visit: Payer: Self-pay

## 2021-02-10 ENCOUNTER — Other Ambulatory Visit (INDEPENDENT_AMBULATORY_CARE_PROVIDER_SITE_OTHER): Payer: No Typology Code available for payment source

## 2021-02-10 DIAGNOSIS — E039 Hypothyroidism, unspecified: Secondary | ICD-10-CM | POA: Diagnosis not present

## 2021-02-10 LAB — TSH: TSH: 2.49 u[IU]/mL (ref 0.35–5.50)

## 2021-03-17 ENCOUNTER — Ambulatory Visit: Payer: No Typology Code available for payment source | Admitting: Family Medicine

## 2021-07-16 ENCOUNTER — Other Ambulatory Visit: Payer: Self-pay | Admitting: Family Medicine

## 2021-08-09 ENCOUNTER — Encounter: Payer: No Typology Code available for payment source | Admitting: Family Medicine

## 2021-09-05 NOTE — Progress Notes (Signed)
? ? ? ? ? ? ? ?Subjective:  ? ? Patient ID: Nancy Higgins, female    DOB: 05-03-1976, 46 y.o.   MRN: [id number] ? ?Chief Complaint  ?Patient presents with  ? Annual Exam  ? ? ?HPI ?Patient is in today for her annual physical exam. She is doing well. No recent febrile illness or hospitalizations. She is planning to move to [facility] soon as her husband has taken a job there. She stays active with their 2 daughters and work. She tries to maintain a heart healthy diet. Denies CP/palp/SOB/HA/congestion/fevers/GI or GU c/o. Taking meds as prescribed  ? ?Past Medical History:  ?Diagnosis Date  ? Abnormal TSH 01/10/2015  ? Allergy   ? Anemia   ? with pregnancy  ? Anxiety   ? Anxiety and depression   ? Cervical cancer screening 10/14/2013  ? Menarche at 13 Regular and moderate flow, dysmenorrhea intermittently no history of abnormal pap in past G2P2, s/p 2 svd No history of MGM concerns today include increased vaginal discharge increased over past few months, no odor, clear Last pap 2 1/2 years ago, normal, no concerns LMP 10/03/13 No gyn surgeries   ? Constipation   ? Constipation 05/22/2012  ? GERD (gastroesophageal reflux disease)   ? History of renal stone   ? History of tobacco abuse 12/29/2014  ? Last cigarette in January 2016 Used patch   ? Hyperlipidemia, mixed 02/06/2016  ? LOM (left otitis media) 05/24/2012  ? Preventative health care 10/19/2013  ? Reflux 05/22/2012  ? Sinusitis 05/22/2012  ? ? ?Past Surgical History:  ?Procedure Laterality Date  ? CYSTOSCOPY W/ URETERAL STENT PLACEMENT    ? and removal  ? KIDNEY DONATION Left 12/2018  ? LAPAROSCOPIC APPENDECTOMY  12/30/2011  ? Procedure: APPENDECTOMY LAPAROSCOPIC;  Surgeon: Edward Jolly, MD;  Location: [facility];  Service: General;  Laterality: N/A;  ? LITHOTRIPSY    ? once during pregnancy  ? ? ?Family History  ?Problem Relation Age of Onset  ? Thyroid disease Mother   ? Other Mother   ?     uterine prolapse  ? Scoliosis Mother   ? Thyroid disease Father    ? Scoliosis Sister   ? Scoliosis Sister   ? Thyroid disease Brother   ? Other Daughter   ?     papilledema  ? Other Maternal Aunt   ?     fungal meningitis, leukocytopenia  ? COPD Maternal Grandmother   ? Colon cancer Maternal Grandfather   ? Breast cancer Neg Hx   ? ? ?Social History  ? ?Socioeconomic History  ? Marital status: Married  ?  Spouse name: Not on file  ? Number of children: 2  ? Years of education: Not on file  ? Highest education level: Not on file  ?Occupational History  ? Occupation: Glass blower/designer   ?Tobacco Use  ? Smoking status: Former  ? Smokeless tobacco: Never  ?Vaping Use  ? Vaping Use: Never used  ?Substance and Sexual Activity  ? Alcohol use: Yes  ?  Comment: occasional  ? Drug use: No  ? Sexual activity: Not on file  ?  Comment: lives with husband and daughters, no dietary restrictions  ?Other Topics Concern  ? Not on file  ?Social History Narrative  ? Not on file  ? ?Social Determinants of Health  ? ?Financial Resource Strain: Not on file  ?Food Insecurity: Not on file  ?Transportation Needs: Not on file  ?Physical Activity: Not on  file  ?Stress: Not on file  ?Social Connections: Not on file  ?Intimate Partner Violence: Not on file  ? ? ?Outpatient Medications Prior to Visit  ?Medication Sig Dispense Refill  ? levothyroxine (SYNTHROID) 50 MCG tablet TAKE 1 TABLET BY MOUTH EVERY DAY BEFORE BREAKFAST 90 tablet 0  ? linaclotide (LINZESS) 290 MCG CAPS capsule Take 1 capsule (290 mcg total) by mouth daily. 30 capsule 3  ? ?No facility-administered medications prior to visit.  ? ? ?No Known Allergies ? ?Review of Systems  ?Constitutional:  Negative for chills, fever and malaise/fatigue.  ?HENT:  Negative for congestion and hearing loss.   ?Eyes:  Negative for blurred vision and discharge.  ?Respiratory:  Negative for cough, sputum production and shortness of breath.   ?Cardiovascular:  Negative for chest pain, palpitations and leg swelling.  ?Gastrointestinal:  Negative for abdominal pain,  blood in stool, constipation, diarrhea, heartburn, nausea and vomiting.  ?Genitourinary:  Negative for dysuria, frequency, hematuria and urgency.  ?Musculoskeletal:  Negative for back pain, falls and myalgias.  ?Skin:  Negative for rash.  ?Neurological:  Negative for dizziness, sensory change, loss of consciousness, weakness and headaches.  ?Endo/Heme/Allergies:  Negative for environmental allergies. Does not bruise/bleed easily.  ?Psychiatric/Behavioral:  Negative for depression and suicidal ideas. The patient is not nervous/anxious and does not have insomnia.   ? ?   ?Objective:  ?  ?Physical Exam ?Constitutional:   ?   General: She is not in acute distress. ?   Appearance: She is well-developed.  ?HENT:  ?   Head: Normocephalic and atraumatic.  ?Eyes:  ?   Conjunctiva/sclera: Conjunctivae normal.  ?Neck:  ?   Thyroid: No thyromegaly.  ?Cardiovascular:  ?   Rate and Rhythm: Normal rate and regular rhythm.  ?   Heart sounds: Normal heart sounds. No murmur heard. ?Pulmonary:  ?   Effort: Pulmonary effort is normal. No respiratory distress.  ?   Breath sounds: Normal breath sounds.  ?Abdominal:  ?   General: Bowel sounds are normal. There is no distension.  ?   Palpations: Abdomen is soft. There is no mass.  ?   Tenderness: There is no abdominal tenderness.  ?Musculoskeletal:  ?   Cervical back: Neck supple.  ?Lymphadenopathy:  ?   Cervical: No cervical adenopathy.  ?Skin: ?   General: Skin is warm and dry.  ?Neurological:  ?   Mental Status: She is alert and oriented to person, place, and time.  ?Psychiatric:     ?   Behavior: Behavior normal.  ? ? ?BP 104/68 (BP Location: Left Arm, Patient Position: Sitting, Cuff Size: Normal)   Pulse 60   Resp 20   Ht '5\' 2"'$  (1.575 m)   Wt 104 lb 3.2 oz (47.3 kg)   SpO2 99%   BMI 19.06 kg/m?  ?Wt Readings from Last 3 Encounters:  ?09/06/21 104 lb 3.2 oz (47.3 kg)  ?08/05/20 106 lb 9.6 oz (48.4 kg)  ?02/11/19 103 lb (46.7 kg)  ? ? ?Diabetic Foot Exam - Simple   ?No data  filed ?  ? ?Lab Results  ?Component Value Date  ? WBC 6.7 09/06/2021  ? HGB 12.5 09/06/2021  ? HCT 37.0 09/06/2021  ? PLT 325.0 09/06/2021  ? GLUCOSE 78 09/06/2021  ? CHOL 219 (H) 09/06/2021  ? TRIG 80.0 09/06/2021  ? HDL 94.50 09/06/2021  ? LDLCALC 109 (H) 09/06/2021  ? ALT 8 09/06/2021  ? AST 15 09/06/2021  ? NA 137 09/06/2021  ? K 4.2  09/06/2021  ? CL 102 09/06/2021  ? CREATININE 0.94 09/06/2021  ? BUN 13 09/06/2021  ? CO2 28 09/06/2021  ? TSH 1.37 09/06/2021  ? ? ?Lab Results  ?Component Value Date  ? TSH 1.37 09/06/2021  ? ?Lab Results  ?Component Value Date  ? WBC 6.7 09/06/2021  ? HGB 12.5 09/06/2021  ? HCT 37.0 09/06/2021  ? MCV 94.5 09/06/2021  ? PLT 325.0 09/06/2021  ? ?Lab Results  ?Component Value Date  ? NA 137 09/06/2021  ? K 4.2 09/06/2021  ? CO2 28 09/06/2021  ? GLUCOSE 78 09/06/2021  ? BUN 13 09/06/2021  ? CREATININE 0.94 09/06/2021  ? BILITOT 0.4 09/06/2021  ? ALKPHOS 52 09/06/2021  ? AST 15 09/06/2021  ? ALT 8 09/06/2021  ? PROT 6.7 09/06/2021  ? ALBUMIN 4.4 09/06/2021  ? CALCIUM 9.4 09/06/2021  ? GFR 73.95 09/06/2021  ? ?Lab Results  ?Component Value Date  ? CHOL 219 (H) 09/06/2021  ? ?Lab Results  ?Component Value Date  ? HDL 94.50 09/06/2021  ? ?Lab Results  ?Component Value Date  ? LDLCALC 109 (H) 09/06/2021  ? ?Lab Results  ?Component Value Date  ? TRIG 80.0 09/06/2021  ? ?Lab Results  ?Component Value Date  ? CHOLHDL 2 09/06/2021  ? ?No results found for: HGBA1C ? ?   ?Assessment & Plan:  ? ?Problem List Items Addressed This Visit   ? ? Preventative health care (Chronic)  ?  Patient encouraged to maintain heart healthy diet, regular exercise, adequate sleep. Consider daily probiotics. Take medications as prescribed. Labs ordered and reviewed. She may be moving to [facility] soon. Will forward records when requested ?  ?  ? Relevant Orders  ? Lipid panel (Completed)  ? Comprehensive metabolic panel (Completed)  ? TSH (Completed)  ? CBC with Differential/Platelet (Completed)  ? Hypothyroid  ?   On Levothyroxine, continue to monitor ?  ?  ? Relevant Orders  ? TSH (Completed)  ? Hyperlipidemia, mixed  ?  Encourage heart healthy diet such as MIND or DASH diet, increase exercise, avoid trans fats, simple c

## 2021-09-06 ENCOUNTER — Encounter: Payer: Self-pay | Admitting: Family Medicine

## 2021-09-06 ENCOUNTER — Ambulatory Visit (INDEPENDENT_AMBULATORY_CARE_PROVIDER_SITE_OTHER): Payer: Managed Care, Other (non HMO) | Admitting: Family Medicine

## 2021-09-06 VITALS — BP 104/68 | HR 60 | Resp 20 | Ht 62.0 in | Wt 104.2 lb

## 2021-09-06 DIAGNOSIS — Z0000 Encounter for general adult medical examination without abnormal findings: Secondary | ICD-10-CM | POA: Diagnosis not present

## 2021-09-06 DIAGNOSIS — E782 Mixed hyperlipidemia: Secondary | ICD-10-CM | POA: Diagnosis not present

## 2021-09-06 DIAGNOSIS — E039 Hypothyroidism, unspecified: Secondary | ICD-10-CM

## 2021-09-06 DIAGNOSIS — Z1211 Encounter for screening for malignant neoplasm of colon: Secondary | ICD-10-CM | POA: Diagnosis not present

## 2021-09-06 NOTE — Patient Instructions (Addendum)
Encouraged increased hydration and fiber in diet. Daily probiotics. If bowels not moving can use MOM 2 tbls po in 4 oz of warm prune juice by mouth every 2-3 days. If no results then repeat in 4 hours with  Dulcolax suppository pr, may repeat again in 4 more hours as needed. Seek care if symptoms worsen. Consider daily Miralax and/or Dulcolax if symptoms persist.   ? ?Preventive Care 28-45 Years Old, Female ?Preventive care refers to lifestyle choices and visits with your health care provider that can promote health and wellness. Preventive care visits are also called wellness exams. ?What can I expect for my preventive care visit? ?Counseling ?Your health care provider may ask you questions about your: ?Medical history, including: ?Past medical problems. ?Family medical history. ?Pregnancy history. ?Current health, including: ?Menstrual cycle. ?Method of birth control. ?Emotional well-being. ?Home life and relationship well-being. ?Sexual activity and sexual health. ?Lifestyle, including: ?Alcohol, nicotine or tobacco, and drug use. ?Access to firearms. ?Diet, exercise, and sleep habits. ?Work and work Statistician. ?Sunscreen use. ?Safety issues such as seatbelt and bike helmet use. ?Physical exam ?Your health care provider will check your: ?Height and weight. These may be used to calculate your BMI (body mass index). BMI is a measurement that tells if you are at a healthy weight. ?Waist circumference. This measures the distance around your waistline. This measurement also tells if you are at a healthy weight and may help predict your risk of certain diseases, such as type 2 diabetes and high blood pressure. ?Heart rate and blood pressure. ?Body temperature. ?Skin for abnormal spots. ?What immunizations do I need? ?Vaccines are usually given at various ages, according to a schedule. Your health care provider will recommend vaccines for you based on your age, medical history, and lifestyle or other factors, such as  travel or where you work. ?What tests do I need? ?Screening ?Your health care provider may recommend screening tests for certain conditions. This may include: ?Lipid and cholesterol levels. ?Diabetes screening. This is done by checking your blood sugar (glucose) after you have not eaten for a while (fasting). ?Pelvic exam and Pap test. ?Hepatitis B test. ?Hepatitis C test. ?HIV (human immunodeficiency virus) test. ?STI (sexually transmitted infection) testing, if you are at risk. ?Lung cancer screening. ?Colorectal cancer screening. ?Mammogram. Talk with your health care provider about when you should start having regular mammograms. This may depend on whether you have a family history of breast cancer. ?BRCA-related cancer screening. This may be done if you have a family history of breast, ovarian, tubal, or peritoneal cancers. ?Bone density scan. This is done to screen for osteoporosis. ?Talk with your health care provider about your test results, treatment options, and if necessary, the need for more tests. ?Follow these instructions at home: ?Eating and drinking ? ?Eat a diet that includes fresh fruits and vegetables, whole grains, lean protein, and low-fat dairy products. ?Take vitamin and mineral supplements as recommended by your health care provider. ?Do not drink alcohol if: ?Your health care provider tells you not to drink. ?You are pregnant, may be pregnant, or are planning to become pregnant. ?If you drink alcohol: ?Limit how much you have to 0-1 drink a day. ?Know how much alcohol is in your drink. In the [place] one drink equals one 12 oz bottle of beer (355 mL), one 5 oz glass of wine (148 mL), or one 1? oz glass of hard liquor (44 mL). ?Lifestyle ?Brush your teeth every morning and night with fluoride toothpaste. Floss one  time each day. ?Exercise for at least 30 minutes 5 or more days each week. ?Do not use any products that contain nicotine or tobacco. These products include cigarettes, chewing  tobacco, and vaping devices, such as e-cigarettes. If you need help quitting, ask your health care provider. ?Do not use drugs. ?If you are sexually active, practice safe sex. Use a condom or other form of protection to prevent STIs. ?If you do not wish to become pregnant, use a form of birth control. If you plan to become pregnant, see your health care provider for a prepregnancy visit. ?Take aspirin only as told by your health care provider. Make sure that you understand how much to take and what form to take. Work with your health care provider to find out whether it is safe and beneficial for you to take aspirin daily. ?Find healthy ways to manage stress, such as: ?Meditation, yoga, or listening to music. ?Journaling. ?Talking to a trusted person. ?Spending time with friends and family. ?Minimize exposure to UV radiation to reduce your risk of skin cancer. ?Safety ?Always wear your seat belt while driving or riding in a vehicle. ?Do not drive: ?If you have been drinking alcohol. Do not ride with someone who has been drinking. ?When you are tired or distracted. ?While texting. ?If you have been using any mind-altering substances or drugs. ?Wear a helmet and other protective equipment during sports activities. ?If you have firearms in your house, make sure you follow all gun safety procedures. ?Seek help if you have been physically or sexually abused. ?What's next? ?Visit your health care provider once a year for an annual wellness visit. ?Ask your health care provider how often you should have your eyes and teeth checked. ?Stay up to date on all vaccines. ?This information is not intended to replace advice given to you by your health care provider. Make sure you discuss any questions you have with your health care provider. ?Document Revised: 12/01/2020 Document Reviewed: 12/01/2020 ?Elsevier Patient Education ? [facility]. ? ?

## 2021-09-07 LAB — COMPREHENSIVE METABOLIC PANEL
ALT: 8 U/L (ref 0–35)
AST: 15 U/L (ref 0–37)
Albumin: 4.4 g/dL (ref 3.5–5.2)
Alkaline Phosphatase: 52 U/L (ref 39–117)
BUN: 13 mg/dL (ref 6–23)
CO2: 28 mEq/L (ref 19–32)
Calcium: 9.4 mg/dL (ref 8.4–10.5)
Chloride: 102 mEq/L (ref 96–112)
Creatinine, Ser: 0.94 mg/dL (ref 0.40–1.20)
GFR: 73.95 mL/min (ref >60.00)
Glucose, Bld: 78 mg/dL (ref 70–99)
Potassium: 4.2 mEq/L (ref 3.5–5.1)
Sodium: 137 mEq/L (ref 135–145)
Total Bilirubin: 0.4 mg/dL (ref 0.2–1.2)
Total Protein: 6.7 g/dL (ref 6.0–8.3)

## 2021-09-07 LAB — CBC WITH DIFFERENTIAL/PLATELET
Basophils Absolute: 0.1 10*3/uL (ref 0.0–0.1)
Basophils Relative: 1.2 % (ref 0.0–3.0)
Eosinophils Absolute: 0.8 10*3/uL — ABNORMAL HIGH (ref 0.0–0.7)
Eosinophils Relative: 11.2 % — ABNORMAL HIGH (ref 0.0–5.0)
HCT: 37 % (ref 36.0–46.0)
Hemoglobin: 12.5 g/dL (ref 12.0–15.0)
Lymphocytes Relative: 28.8 % (ref 12.0–46.0)
Lymphs Abs: 1.9 10*3/uL (ref 0.7–4.0)
MCHC: 33.7 g/dL (ref 30.0–36.0)
MCV: 94.5 fl (ref 78.0–100.0)
Monocytes Absolute: 0.5 10*3/uL (ref 0.1–1.0)
Monocytes Relative: 7.3 % (ref 3.0–12.0)
Neutro Abs: 3.5 10*3/uL (ref 1.4–7.7)
Neutrophils Relative %: 51.5 % (ref 43.0–77.0)
Platelets: 325 10*3/uL (ref 150.0–400.0)
RBC: 3.92 Mil/uL (ref 3.87–5.11)
RDW: 12.3 % (ref 11.5–15.5)
WBC: 6.7 10*3/uL (ref 4.0–10.5)

## 2021-09-07 LAB — LIPID PANEL
Cholesterol: 219 mg/dL — ABNORMAL HIGH (ref 0–200)
HDL: 94.5 mg/dL (ref >39.00)
LDL Cholesterol: 109 mg/dL — ABNORMAL HIGH (ref 0–99)
NonHDL: 124.54
Total CHOL/HDL Ratio: 2
Triglycerides: 80 mg/dL (ref 0.0–149.0)
VLDL: 16 mg/dL (ref 0.0–40.0)

## 2021-09-07 LAB — TSH: TSH: 1.37 u[IU]/mL (ref 0.35–5.50)

## 2021-09-07 NOTE — Assessment & Plan Note (Signed)
On Levothyroxine, continue to monitor 

## 2021-09-07 NOTE — Assessment & Plan Note (Signed)
Encourage heart healthy diet such as MIND or DASH diet, increase exercise, avoid trans fats, simple carbohydrates and processed foods, consider a krill or fish or flaxseed oil cap daily.  °

## 2021-09-07 NOTE — Assessment & Plan Note (Signed)
Patient encouraged to maintain heart healthy diet, regular exercise, adequate sleep. Consider daily probiotics. Take medications as prescribed. Labs ordered and reviewed. She may be moving to [facility] soon. Will forward records when requested ?

## 2021-09-08 IMAGING — MG MM DIGITAL SCREENING BILAT W/ TOMO AND CAD
8 series · 9 of 24 positions shown · non-contrast
Comparison: Previous exam(s).

CLINICAL DATA: Screening.

EXAM:
DIGITAL SCREENING BILATERAL MAMMOGRAM WITH TOMOSYNTHESIS AND CAD
TECHNIQUE: Bilateral screening digital craniocaudal and mediolateral oblique
mammograms were obtained. Bilateral screening digital breast
tomosynthesis was performed. The images were evaluated with
computer-aided detection.

[L MLO synth-2D]
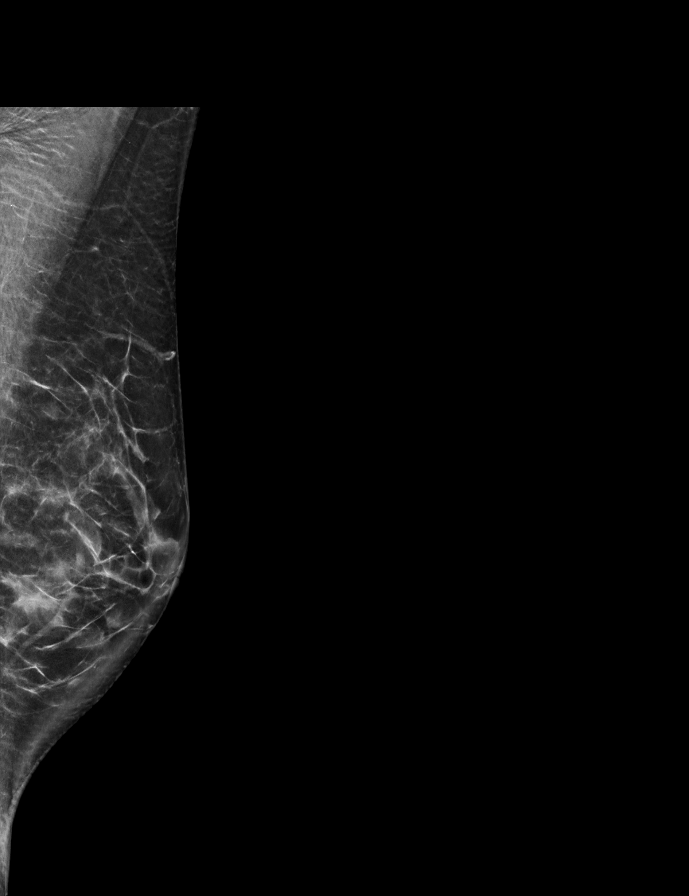

[R CC synth-2D]
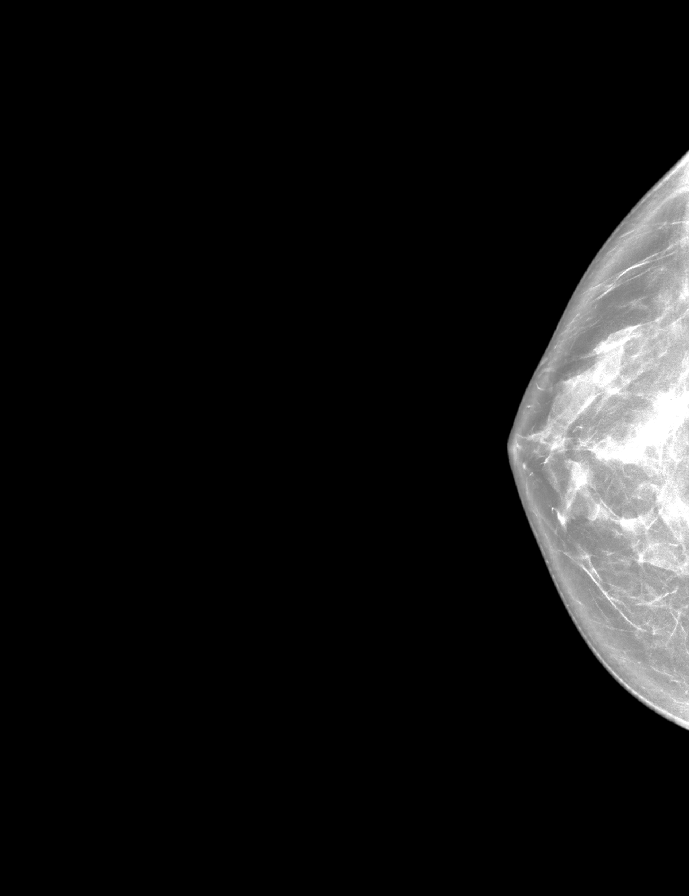

[R MLO synth-2D]
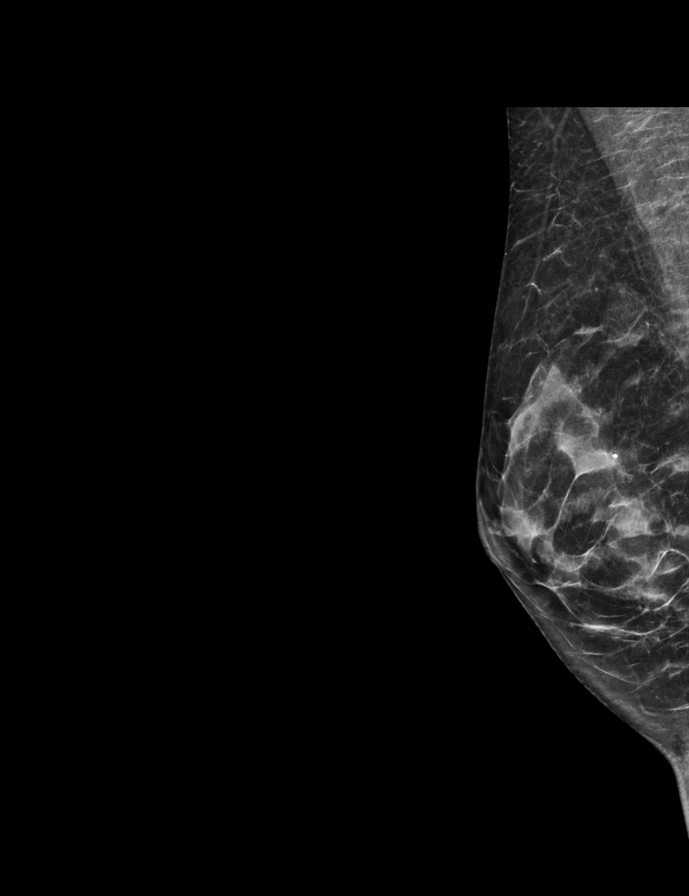

[L CC synth-2D]
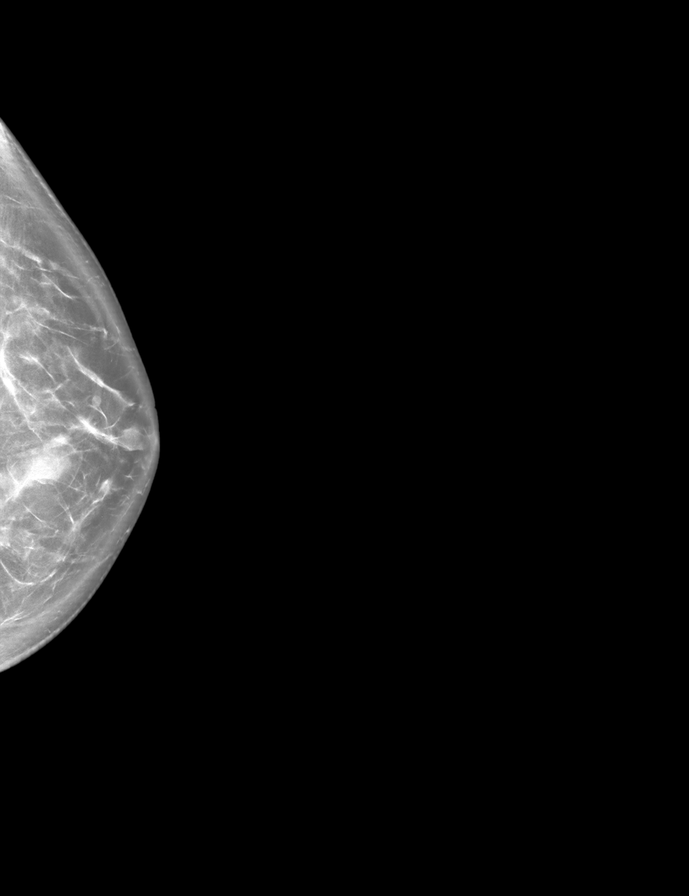

[R CC tomo · 2 of 67 frames shown]
[frame 22/67]
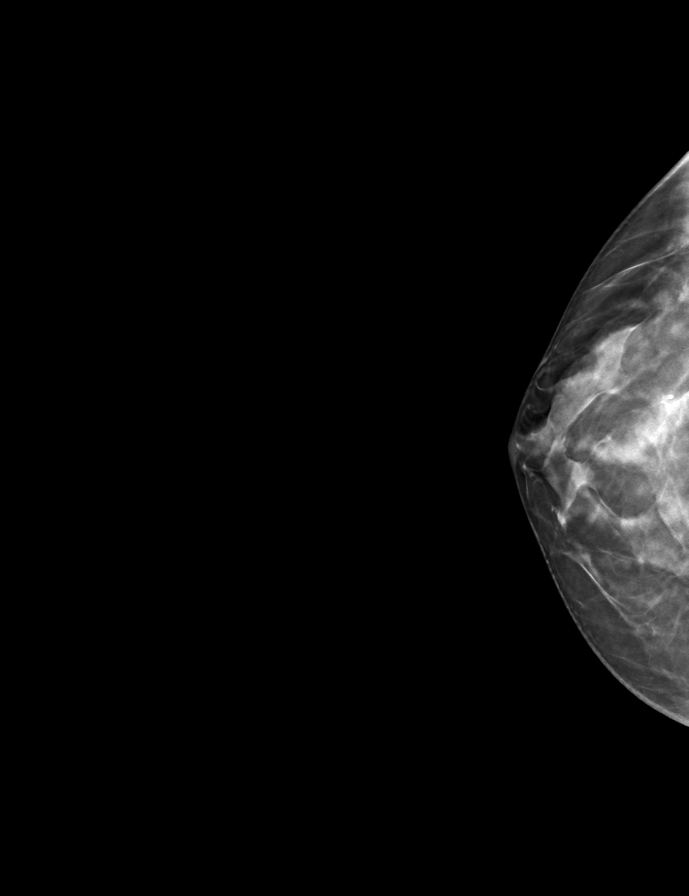
[frame 34/67]
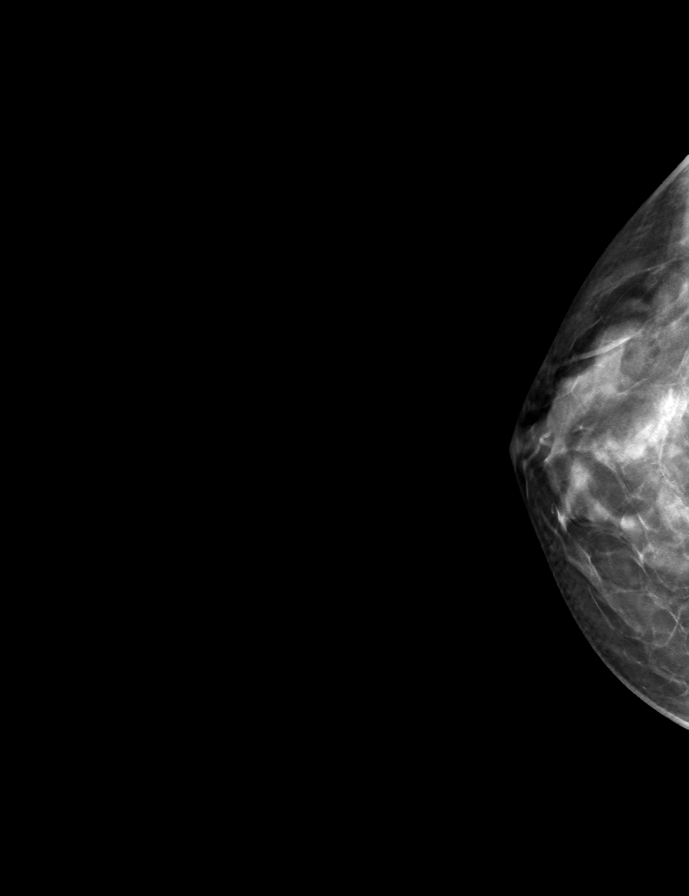

[R MLO tomo · tomo slice 28/55.0]
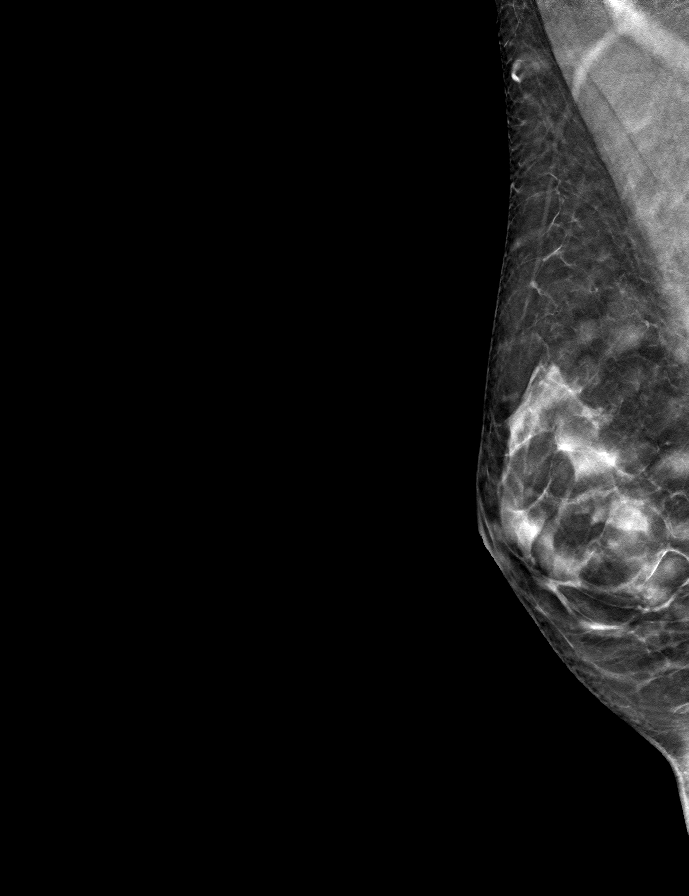

[L MLO tomo · tomo slice 35/70.0]
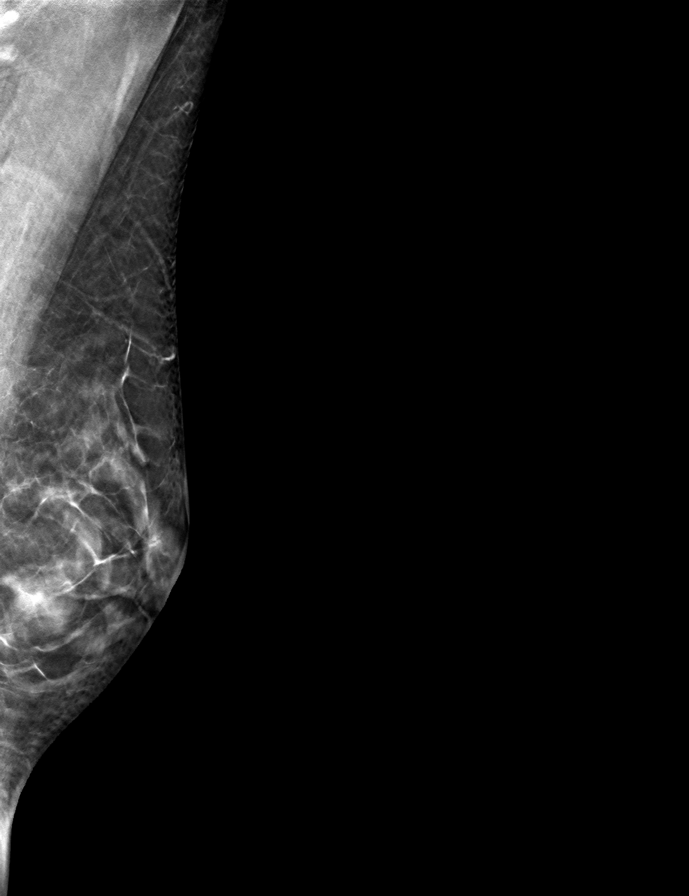

[L CC tomo · tomo slice 37/73.0]
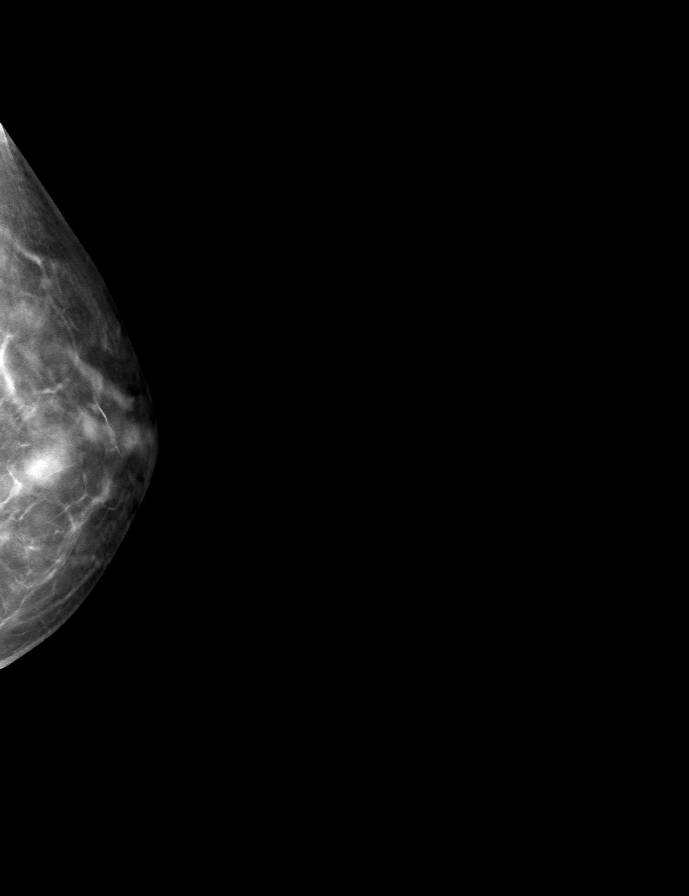

[9 of 24 positions shown; findings below may reference images not displayed]

ACR Breast Density Category c: The breast tissue is heterogeneously
dense, which may obscure small masses.
FINDINGS: There are no findings suspicious for malignancy. The images were
evaluated with computer-aided detection.
IMPRESSION: No mammographic evidence of malignancy. A result letter of this
screening mammogram will be mailed directly to the patient.

RECOMMENDATION:
Screening mammogram in one year. (Code:T4-5-GWO)

BI-RADS CATEGORY  1: Negative.

## 2021-10-14 ENCOUNTER — Other Ambulatory Visit: Payer: Self-pay | Admitting: Family Medicine

## 2022-01-16 ENCOUNTER — Other Ambulatory Visit: Payer: Self-pay | Admitting: Family Medicine

## 2022-04-13 ENCOUNTER — Encounter: Payer: Self-pay | Admitting: Family Medicine

## 2022-04-13 DIAGNOSIS — E039 Hypothyroidism, unspecified: Secondary | ICD-10-CM

## 2022-04-14 NOTE — Telephone Encounter (Signed)
Called pt and sent to [facility] was advised

## 2022-04-18 ENCOUNTER — Other Ambulatory Visit: Payer: Self-pay

## 2022-04-18 DIAGNOSIS — E039 Hypothyroidism, unspecified: Secondary | ICD-10-CM

## 2022-04-19 ENCOUNTER — Other Ambulatory Visit: Payer: Self-pay | Admitting: Family Medicine

## 2022-04-20 LAB — T4, FREE: Free T4: 1.15 ng/dL (ref 0.82–1.77)

## 2022-04-20 LAB — TSH: TSH: 2.05 u[IU]/mL (ref 0.450–4.500)

## 2022-09-11 ENCOUNTER — Encounter: Payer: Managed Care, Other (non HMO) | Admitting: Family Medicine

## 2022-12-24 ENCOUNTER — Other Ambulatory Visit: Payer: Self-pay | Admitting: Family Medicine

## 2022-12-25 ENCOUNTER — Encounter: Payer: Self-pay | Admitting: *Deleted
